# Patient Record
Sex: Male | Born: 1960 | Race: Black or African American | Hispanic: No | Marital: Single | Smoking: Current some day smoker
Health system: Southern US, Community
[De-identification: ages and names within clinical notes are randomized; demographics above are authoritative.]

## PROBLEM LIST (undated history)

## (undated) DIAGNOSIS — I509 Heart failure, unspecified: Secondary | ICD-10-CM

## (undated) DIAGNOSIS — N183 Chronic kidney disease, stage 3 (moderate): Secondary | ICD-10-CM

## (undated) DIAGNOSIS — I10 Essential (primary) hypertension: Secondary | ICD-10-CM

## (undated) DIAGNOSIS — M109 Gout, unspecified: Secondary | ICD-10-CM

## (undated) DIAGNOSIS — E785 Hyperlipidemia, unspecified: Secondary | ICD-10-CM

## (undated) DIAGNOSIS — D509 Iron deficiency anemia, unspecified: Secondary | ICD-10-CM

## (undated) HISTORY — DX: Iron deficiency anemia, unspecified: D50.9

## (undated) HISTORY — DX: Heart failure, unspecified: I50.9

## (undated) HISTORY — DX: Gout, unspecified: M10.9

## (undated) HISTORY — DX: Chronic kidney disease, stage 3 (moderate): N18.3

---

## 2012-06-06 ENCOUNTER — Inpatient Hospital Stay (HOSPITAL_BASED_OUTPATIENT_CLINIC_OR_DEPARTMENT_OTHER)
Admission: EM | Admit: 2012-06-06 | Discharge: 2012-06-10 | DRG: 293 | Disposition: A | Discharge status: Home / Self Care | Payer: Medicaid Other | Attending: Internal Medicine | Admitting: Internal Medicine

## 2012-06-06 ENCOUNTER — Encounter (HOSPITAL_BASED_OUTPATIENT_CLINIC_OR_DEPARTMENT_OTHER): Payer: Self-pay

## 2012-06-06 ENCOUNTER — Emergency Department (HOSPITAL_BASED_OUTPATIENT_CLINIC_OR_DEPARTMENT_OTHER): Payer: Medicaid Other

## 2012-06-06 DIAGNOSIS — M25532 Pain in left wrist: Secondary | ICD-10-CM | POA: Diagnosis present

## 2012-06-06 DIAGNOSIS — M7989 Other specified soft tissue disorders: Secondary | ICD-10-CM

## 2012-06-06 DIAGNOSIS — Z9119 Patient's noncompliance with other medical treatment and regimen: Secondary | ICD-10-CM

## 2012-06-06 DIAGNOSIS — I10 Essential (primary) hypertension: Secondary | ICD-10-CM | POA: Diagnosis present

## 2012-06-06 DIAGNOSIS — R7309 Other abnormal glucose: Secondary | ICD-10-CM | POA: Diagnosis present

## 2012-06-06 DIAGNOSIS — I119 Hypertensive heart disease without heart failure: Secondary | ICD-10-CM

## 2012-06-06 DIAGNOSIS — I129 Hypertensive chronic kidney disease with stage 1 through stage 4 chronic kidney disease, or unspecified chronic kidney disease: Secondary | ICD-10-CM | POA: Diagnosis present

## 2012-06-06 DIAGNOSIS — N189 Chronic kidney disease, unspecified: Secondary | ICD-10-CM | POA: Diagnosis present

## 2012-06-06 DIAGNOSIS — I517 Cardiomegaly: Secondary | ICD-10-CM | POA: Diagnosis present

## 2012-06-06 DIAGNOSIS — D509 Iron deficiency anemia, unspecified: Secondary | ICD-10-CM | POA: Diagnosis present

## 2012-06-06 DIAGNOSIS — I5032 Chronic diastolic (congestive) heart failure: Secondary | ICD-10-CM | POA: Diagnosis present

## 2012-06-06 DIAGNOSIS — D649 Anemia, unspecified: Secondary | ICD-10-CM

## 2012-06-06 DIAGNOSIS — E876 Hypokalemia: Secondary | ICD-10-CM | POA: Diagnosis present

## 2012-06-06 DIAGNOSIS — N183 Chronic kidney disease, stage 3 unspecified: Secondary | ICD-10-CM

## 2012-06-06 DIAGNOSIS — I509 Heart failure, unspecified: Secondary | ICD-10-CM

## 2012-06-06 DIAGNOSIS — E785 Hyperlipidemia, unspecified: Secondary | ICD-10-CM | POA: Diagnosis present

## 2012-06-06 DIAGNOSIS — M109 Gout, unspecified: Secondary | ICD-10-CM | POA: Diagnosis present

## 2012-06-06 DIAGNOSIS — F17200 Nicotine dependence, unspecified, uncomplicated: Secondary | ICD-10-CM | POA: Diagnosis present

## 2012-06-06 DIAGNOSIS — Z91199 Patient's noncompliance with other medical treatment and regimen due to unspecified reason: Secondary | ICD-10-CM

## 2012-06-06 HISTORY — DX: Hyperlipidemia, unspecified: E78.5

## 2012-06-06 HISTORY — DX: Iron deficiency anemia, unspecified: D50.9

## 2012-06-06 HISTORY — DX: Essential (primary) hypertension: I10

## 2012-06-06 HISTORY — DX: Heart failure, unspecified: I50.9

## 2012-06-06 HISTORY — DX: Chronic kidney disease, stage 3 unspecified: N18.30

## 2012-06-06 LAB — CBC WITH DIFFERENTIAL/PLATELET
Basophils Absolute: 0 10*3/uL (ref 0.0–0.1)
Eosinophils Absolute: 0.3 10*3/uL (ref 0.0–0.7)
Eosinophils Relative: 3 % (ref 0–5)
Lymphocytes Relative: 23 % (ref 12–46)
MCH: 27.8 pg (ref 26.0–34.0)
MCV: 77.3 fL — ABNORMAL LOW (ref 78.0–100.0)
Platelets: 225 10*3/uL (ref 150–400)
RDW: 13.7 % (ref 11.5–15.5)
WBC: 7.8 10*3/uL (ref 4.0–10.5)

## 2012-06-06 LAB — URIC ACID: Uric Acid, Serum: 9.7 mg/dL — ABNORMAL HIGH (ref 4.0–7.8)

## 2012-06-06 LAB — CBC
HCT: 35.8 % — ABNORMAL LOW (ref 39.0–52.0)
Hemoglobin: 12.8 g/dL — ABNORMAL LOW (ref 13.0–17.0)
MCV: 76.7 fL — ABNORMAL LOW (ref 78.0–100.0)
RBC: 4.67 MIL/uL (ref 4.22–5.81)
RDW: 14 % (ref 11.5–15.5)
WBC: 9.2 10*3/uL (ref 4.0–10.5)

## 2012-06-06 LAB — COMPREHENSIVE METABOLIC PANEL
ALT: 26 U/L (ref 0–53)
AST: 26 U/L (ref 0–37)
Calcium: 9.6 mg/dL (ref 8.4–10.5)
Sodium: 140 mEq/L (ref 135–145)
Total Protein: 7.1 g/dL (ref 6.0–8.3)

## 2012-06-06 LAB — CREATININE, SERUM: GFR calc Af Amer: 59 mL/min — ABNORMAL LOW (ref >90)

## 2012-06-06 LAB — RETICULOCYTES: Retic Count, Absolute: 51.4 10*3/uL (ref 19.0–186.0)

## 2012-06-06 MED ORDER — HYDRALAZINE HCL 20 MG/ML IJ SOLN
10.0000 mg | INTRAMUSCULAR | Status: DC | PRN
  Administered 2012-06-07 – 2012-06-09 (×3): 10 mg via INTRAVENOUS
  Filled 2012-06-06 (×3): qty 1

## 2012-06-06 MED ORDER — HYDRALAZINE HCL 20 MG/ML IJ SOLN
10.0000 mg | Freq: Once | INTRAMUSCULAR | Status: AC
  Administered 2012-06-06: 10 mg via INTRAVENOUS
  Filled 2012-06-06: qty 1

## 2012-06-06 MED ORDER — ACETAMINOPHEN 325 MG PO TABS
650.0000 mg | ORAL_TABLET | Freq: Four times a day (QID) | ORAL | Status: DC | PRN
  Filled 2012-06-06: qty 2

## 2012-06-06 MED ORDER — FUROSEMIDE 40 MG PO TABS
40.0000 mg | ORAL_TABLET | Freq: Every day | ORAL | Status: DC
  Administered 2012-06-07: 40 mg via ORAL
  Filled 2012-06-06 (×2): qty 1

## 2012-06-06 MED ORDER — HYDRALAZINE HCL 25 MG PO TABS
25.0000 mg | ORAL_TABLET | Freq: Three times a day (TID) | ORAL | Status: DC
  Administered 2012-06-06 – 2012-06-10 (×11): 25 mg via ORAL
  Filled 2012-06-06 (×14): qty 1

## 2012-06-06 MED ORDER — ENOXAPARIN SODIUM 40 MG/0.4ML ~~LOC~~ SOLN
40.0000 mg | SUBCUTANEOUS | Status: DC
  Administered 2012-06-06 – 2012-06-09 (×4): 40 mg via SUBCUTANEOUS
  Filled 2012-06-06 (×5): qty 0.4

## 2012-06-06 MED ORDER — ONDANSETRON HCL 4 MG/2ML IJ SOLN: 4.0000 mg | Freq: Four times a day (QID) | INTRAMUSCULAR | Status: DC | PRN

## 2012-06-06 MED ORDER — CLONIDINE HCL 0.1 MG PO TABS
0.2000 mg | ORAL_TABLET | Freq: Once | ORAL | Status: AC
  Administered 2012-06-06: 0.2 mg via ORAL
  Filled 2012-06-06: qty 2

## 2012-06-06 MED ORDER — HYDROCODONE-ACETAMINOPHEN 5-325 MG PO TABS
1.0000 | ORAL_TABLET | ORAL | Status: DC | PRN
  Administered 2012-06-06 – 2012-06-10 (×10): 1 via ORAL
  Filled 2012-06-06 (×10): qty 1

## 2012-06-06 MED ORDER — ACETAMINOPHEN 650 MG RE SUPP: 650.0000 mg | Freq: Four times a day (QID) | RECTAL | Status: DC | PRN

## 2012-06-06 MED ORDER — ONDANSETRON HCL 4 MG PO TABS: 4.0000 mg | ORAL_TABLET | Freq: Four times a day (QID) | ORAL | Status: DC | PRN

## 2012-06-06 MED ORDER — FUROSEMIDE 40 MG PO TABS
40.0000 mg | ORAL_TABLET | Freq: Once | ORAL | Status: AC
  Administered 2012-06-06: 40 mg via ORAL
  Filled 2012-06-06: qty 1

## 2012-06-06 MED ORDER — SODIUM CHLORIDE 0.9 % IJ SOLN
3.0000 mL | Freq: Two times a day (BID) | INTRAMUSCULAR | Status: DC
  Administered 2012-06-07 – 2012-06-08 (×2): 3 mL via INTRAVENOUS

## 2012-06-06 MED ORDER — SODIUM CHLORIDE 0.9 % IJ SOLN
3.0000 mL | Freq: Two times a day (BID) | INTRAMUSCULAR | Status: DC
  Administered 2012-06-06 – 2012-06-09 (×5): 3 mL via INTRAVENOUS

## 2012-06-06 NOTE — ED Notes (Addendum)
NSR on monitor denies chest pain or SOB lungs clear all fields, skin W/D alert x3 but pt report L wrist pain 5/10 denies injury.  BP remains elevated after medication 177/96 improved from previous 203/115  EDP aware no new orders obtained. [facility] here to transport to [facility] [id number] report called.

## 2012-06-06 NOTE — ED Provider Notes (Signed)
History     CSN: [id number]  Arrival date & time 06/06/12  1235   First MD Initiated Contact with Patient 06/06/12 1458      Chief Complaint  Patient presents with  . Arm Pain    (Consider location/radiation/quality/duration/timing/severity/associated sxs/prior treatment) Patient is a 52 y.o. male presenting with hypertension. The history is provided by the patient. No language interpreter was used.  Hypertension This is a chronic problem. The current episode started 1 to 4 weeks ago. The problem occurs constantly. The problem has been gradually worsening. Associated symptoms include joint swelling. Nothing aggravates the symptoms. He has tried nothing for the symptoms. The treatment provided moderate relief.   Pt complains of swelling to lower extremities and left arm.   Pt reports left arm is sore,   Pt has not taking blood pressure medications in several years.  Pt does not have a doctor.  Pt denies cough.  No shortness of breath. Past Medical History  Diagnosis Date  . Hypertension   . Hyperlipidemia     No past surgical history on file.  No family history on file.  History  Substance Use Topics  . Smoking status: Current Some Day Smoker  . Smokeless tobacco: Never Used  . Alcohol Use: Yes      Review of Systems  Musculoskeletal: Positive for joint swelling.  All other systems reviewed and are negative.    Allergies  Review of patient's allergies indicates no known allergies.  Home Medications   Current Outpatient Rx  Name  Route  Sig  Dispense  Refill  . ibuprofen (ADVIL,MOTRIN) 200 MG tablet   Oral   Take 200 mg by mouth every 6 (six) hours as needed for pain.           BP 207/133  Pulse 76  Temp(Src) 98.4 F (36.9 C) (Oral)  Resp 20  Ht 6' (1.829 m)  Wt 260 lb (117.935 kg)  BMI 35.25 kg/m2  SpO2 97%  Physical Exam  Nursing note and vitals reviewed. Constitutional: He is oriented to person, place, and time. He appears well-developed and  well-nourished.  HENT:  Head: Normocephalic.  Right Ear: External ear normal.  Left Ear: External ear normal.  Nose: Nose normal.  Mouth/Throat: Oropharynx is clear and moist.  Eyes: Conjunctivae and EOM are normal. Pupils are equal, round, and reactive to light.  Neck: Normal range of motion. Neck supple.  Cardiovascular: Normal rate.   Pulmonary/Chest: Effort normal.  Abdominal: Soft.  Musculoskeletal: He exhibits edema and tenderness.  Pitting edema lower extremity,  Swelling left arm,    Neurological: He is alert and oriented to person, place, and time. He has normal reflexes.  Skin: Skin is warm.    ED Course  Procedures (including critical care time)  Labs Reviewed  CBC WITH DIFFERENTIAL - Abnormal; Notable for the following:    Hemoglobin 12.6 (*)    HCT 35.1 (*)    MCV 77.3 (*)    All other components within normal limits  COMPREHENSIVE METABOLIC PANEL - Abnormal; Notable for the following:    Glucose, Bld 113 (*)    Creatinine, Ser 1.60 (*)    GFR calc non Af Amer 48 (*)    GFR calc Af Amer 55 (*)    All other components within normal limits  PRO B NATRIURETIC PEPTIDE - Abnormal; Notable for the following:    Pro B Natriuretic peptide (BNP) 990.2 (*)    All other components within normal limits  TROPONIN I  Dg Chest 2 View  06/06/2012  *RADIOLOGY REPORT*  Clinical Data: Left forearm pain, history of hypertension  CHEST - 2 VIEW  Comparison: None.  Findings: Mild cardiomegaly noted with ascending aortic ectasia and unfolding.  The lungs are clear.  Right hemidiaphragmatic eventration with presumed right basilar adjacent scarring atelectasis noted.  No pleural effusion.  No acute osseous finding.  IMPRESSION: Cardiomegaly and aortic ectasia.   Original Report Authenticated By: Christiana Pellant, M.D.      1. CHF (congestive heart failure)   2. Hypertension   3. Cardiomegaly - hypertensive       MDM   Date: 06/06/2012  Rate: 76  Rhythm: sinus arrhythmia  QRS  Axis: normal  Intervals: normal  ST/T Wave abnormalities: nonspecific ST changes  Conduction Disutrbances:none  Narrative Interpretation:   Old EKG Reviewed: none available    Pt given IV lasix,  Hydralazine IV.   Pt blood pressure unchanged.       Elson Areas, PA-C 06/06/12 1826  Medical screening examination/treatment/procedure(s) were performed by non-physician practitioner and as supervising physician I was immediately available for consultation/collaboration.  Derwood Kaplan, MD 06/08/12 1610

## 2012-06-06 NOTE — ED Notes (Signed)
Pt states that he woke up today with severe pain in the L forearm.  Pt has some swelling to the arm, c/o incr pain with flexion of wrist.  PMS intact.

## 2012-06-06 NOTE — Progress Notes (Signed)
Called  From [facility] for admission for CHF, uncontrolled HTN, left arm swelling, renal failure , anemia Harold Garza

## 2012-06-06 NOTE — H&P (Signed)
[facility] History and Physical  Harold Garza ZOX:[id number] DOB: 20-Oct-1960 DOA: 06/06/2012  Referring physician: ER physician. PCP: No primary provider on file.  Specialists: None.  Chief Complaint: Left wrist pain.  HPI: Harold Garza is a 52 y.o. male with history of hypertension who has not been taking his medications for last few years due to financial reasons presents to the ER at [facility] with complaints of left wrist pain. Patient has been having left wrist pain since morning and denies any trauma. Patient in addition also has right knee pain for last few weeks. Patient in the ER was found to have mild lower extremity edema and on further questioning patient states that over the last 3-4 weeks patient has been having exertional shortness of breath. Patient pressure also was found to be elevated. Chest x-ray showed cardiomegaly. Patient was given Lasix 40 mg by mouth and clonidine and hydralazine IV. Patient has been admitted for CHF. Patient denies any chest pain productive cough fever chills nausea vomiting abdominal pain. Patient states he has had similar symptoms of CHF 4-5 years ago. Denies having any cardiac catheter.  Review of Systems: As presented in the history of presenting illness, rest negative.  Past Medical History  Diagnosis Date  . Hypertension   . Hyperlipidemia    History reviewed. No pertinent past surgical history. Social History:  reports that he has been smoking.  He has never used smokeless tobacco. He reports that  drinks alcohol. He reports that he does not use illicit drugs. Lives at home. where does patient live-- Can do ADLs. Can patient participate in ADLs?  No Known Allergies  Family History  Problem Relation Age of Onset  . Hypertension Mother       Prior to Admission medications   Medication Sig Start Date End Date Taking? Authorizing Provider  ibuprofen (ADVIL,MOTRIN) 200 MG tablet Take 400 mg by mouth every 6  (six) hours as needed for pain.    Yes Historical Provider, MD   Physical Exam: Filed Vitals:   06/06/12 1830 06/06/12 1900 06/06/12 1930 06/06/12 2013  BP: 215/128 210/132 203/115 175/92  Pulse: 80  81 81  Temp:    98.1 F (36.7 C)  TempSrc:      Resp: 20 19 17 16   Height:      Weight:      SpO2: 100%  100% 98%     General:  Well-developed and nourished.  Eyes: Anicteric no pallor.  ENT: No discharge from the ears eyes nose mouth.  Neck: No mass felt.  Cardiovascular:  S1-S2 heard.  Respiratory: No rhonchi or crepitations.  Abdomen: Soft nontender bowel sounds present.  Skin:  No rash.  Musculoskeletal: Pain on moving left wrist. Bilateral lower extremity edema.  Psychiatric: Appears normal.  Neurologic: Alert awake oriented to time place and person. Moves all extremities.  Labs on Admission:  Basic Metabolic Panel:  Recent Labs Lab 06/06/12 1315  NA 140  K 3.7  CL 104  CO2 24  GLUCOSE 113*  BUN 18  CREATININE 1.60*  CALCIUM 9.6   Liver Function Tests:  Recent Labs Lab 06/06/12 1315  AST 26  ALT 26  ALKPHOS 56  BILITOT 0.4  PROT 7.1  ALBUMIN 3.7   No results found for this basename: LIPASE, AMYLASE,  in the last 168 hours No results found for this basename: AMMONIA,  in the last 168 hours CBC:  Recent Labs Lab 06/06/12 1315  WBC 7.8  NEUTROABS 5.2  HGB  12.6*  HCT 35.1*  MCV 77.3*  PLT 225   Cardiac Enzymes:  Recent Labs Lab 06/06/12 1315  TROPONINI <0.30    BNP (last 3 results)  Recent Labs  06/06/12 1315  PROBNP 990.2*   CBG: No results found for this basename: GLUCAP,  in the last 168 hours  Radiological Exams on Admission: Dg Chest 2 View  06/06/2012  *RADIOLOGY REPORT*  Clinical Data: Left forearm pain, history of hypertension  CHEST - 2 VIEW  Comparison: None.  Findings: Mild cardiomegaly noted with ascending aortic ectasia and unfolding.  The lungs are clear.  Right hemidiaphragmatic eventration with presumed  right basilar adjacent scarring atelectasis noted.  No pleural effusion.  No acute osseous finding.  IMPRESSION: Cardiomegaly and aortic ectasia.   Original Report Authenticated By: Christiana Pellant, M.D.     EKG: Independently reviewed. Normal sinus rhythm with first-degree AV block. Nonspecific T-wave changes.  Assessment/Plan Principal Problem:   CHF (congestive heart failure) Active Problems:   Hypertension   Hyperlipidemia   CKD (chronic kidney disease)   Left arm swelling   Anemia   1. CHF - patient has been placed on Lasix 40 mg daily. Closely follow intake output. Check 2-D echo for EF. 2. Hypertension uncontrolled - I have placed patient on hydralazine 25 mg by mouth 3 times a day. Patient has renal failure so I did not add any lisinopril and the patient has lower extremity edema so I have avoided Norvasc. 3. Left wrist pain and right knee pain - check x-rays. Check uric crystals and sedimentation rate. 4. Renal failure - probably chronic. Check UA and closely follow intake output and metabolic panel. 5. Anemia - check anemia panel. Closely follow CBC. Stool for occult blood. 6. Tobacco abuse - strongly advised to quit smoking. 7. Hyperglycemia - patient's wife states that he was diagnosed with borderline diabetes many years ago. Check hemoglobin A1c.    Code Status: Full code.  Family Communication: Patient's wife at the bedside.  Disposition Plan: Admit to inpatient.    Harold Garza N. [facility] Pager [phone].  If 7PM-7AM, please contact night-coverage [email or web address] Password [facility] 06/06/2012, 9:38 PM

## 2012-06-07 ENCOUNTER — Inpatient Hospital Stay (HOSPITAL_COMMUNITY): Payer: Medicaid Other

## 2012-06-07 DIAGNOSIS — M109 Gout, unspecified: Secondary | ICD-10-CM

## 2012-06-07 DIAGNOSIS — M25532 Pain in left wrist: Secondary | ICD-10-CM | POA: Diagnosis present

## 2012-06-07 DIAGNOSIS — R0602 Shortness of breath: Secondary | ICD-10-CM

## 2012-06-07 DIAGNOSIS — R609 Edema, unspecified: Secondary | ICD-10-CM

## 2012-06-07 LAB — BASIC METABOLIC PANEL
GFR calc non Af Amer: 54 mL/min — ABNORMAL LOW (ref >90)
Glucose, Bld: 119 mg/dL — ABNORMAL HIGH (ref 70–99)
Potassium: 3.1 mEq/L — ABNORMAL LOW (ref 3.5–5.1)
Sodium: 139 mEq/L (ref 135–145)

## 2012-06-07 LAB — RAPID URINE DRUG SCREEN, HOSP PERFORMED
Barbiturates: NOT DETECTED
Cocaine: NOT DETECTED

## 2012-06-07 LAB — URINALYSIS, ROUTINE W REFLEX MICROSCOPIC
Bilirubin Urine: NEGATIVE
Nitrite: NEGATIVE
Protein, ur: 100 mg/dL — AB
Specific Gravity, Urine: 1.016 (ref 1.005–1.030)
Urobilinogen, UA: 1 mg/dL (ref 0.0–1.0)

## 2012-06-07 LAB — URINE MICROSCOPIC-ADD ON

## 2012-06-07 LAB — FOLATE: Folate: 13.2 ng/mL

## 2012-06-07 LAB — IRON AND TIBC
Iron: 29 ug/dL — ABNORMAL LOW (ref 42–135)
Saturation Ratios: 10 % — ABNORMAL LOW (ref 20–55)
TIBC: 289 ug/dL (ref 215–435)
UIBC: 260 ug/dL (ref 125–400)

## 2012-06-07 LAB — CBC
Hemoglobin: 12.3 g/dL — ABNORMAL LOW (ref 13.0–17.0)
MCH: 27 pg (ref 26.0–34.0)
RBC: 4.55 MIL/uL (ref 4.22–5.81)
WBC: 9 10*3/uL (ref 4.0–10.5)

## 2012-06-07 LAB — HEMOGLOBIN A1C
Hgb A1c MFr Bld: 5.8 % — ABNORMAL HIGH
Mean Plasma Glucose: 120 mg/dL — ABNORMAL HIGH

## 2012-06-07 LAB — VITAMIN B12: Vitamin B-12: 399 pg/mL (ref 211–911)

## 2012-06-07 MED ORDER — AMLODIPINE BESYLATE 10 MG PO TABS
10.0000 mg | ORAL_TABLET | Freq: Every day | ORAL | Status: DC
  Administered 2012-06-07 – 2012-06-09 (×3): 10 mg via ORAL
  Filled 2012-06-07 (×4): qty 1

## 2012-06-07 MED ORDER — COLCHICINE 0.6 MG PO TABS
0.6000 mg | ORAL_TABLET | Freq: Three times a day (TID) | ORAL | Status: DC
  Administered 2012-06-07 – 2012-06-10 (×10): 0.6 mg via ORAL
  Filled 2012-06-07 (×12): qty 1

## 2012-06-07 MED ORDER — HYDROMORPHONE HCL PF 1 MG/ML IJ SOLN
1.0000 mg | INTRAMUSCULAR | Status: DC | PRN
  Administered 2012-06-07 – 2012-06-10 (×9): 1 mg via INTRAVENOUS
  Filled 2012-06-07 (×9): qty 1

## 2012-06-07 MED ORDER — POTASSIUM CHLORIDE CRYS ER 20 MEQ PO TBCR
40.0000 meq | EXTENDED_RELEASE_TABLET | Freq: Two times a day (BID) | ORAL | Status: AC
  Administered 2012-06-07 – 2012-06-08 (×2): 40 meq via ORAL
  Filled 2012-06-07 (×2): qty 2

## 2012-06-07 NOTE — Consult Note (Signed)
Reason for Consult:left hand and wrist swelling x 24 hours Referring Physician: laza  Harold Garza is an 52 y.o. male.  HPI: as above with multiple medical issues with 24 hour h/o left wrist and hand swelling with no trauma or penetrating injury  Past Medical History  Diagnosis Date  . Hypertension   . Hyperlipidemia     History reviewed. No pertinent past surgical history.  Family History  Problem Relation Age of Onset  . Hypertension Mother     Social History:  reports that he has been smoking.  He has never used smokeless tobacco. He reports that  drinks alcohol. He reports that he does not use illicit drugs.  Allergies: No Known Allergies  Medications:  Scheduled: . amLODipine  10 mg Oral QHS  . colchicine  0.6 mg Oral TID  . enoxaparin (LOVENOX) injection  40 mg Subcutaneous Q24H  . furosemide  40 mg Oral Daily  . hydrALAZINE  25 mg Oral Q8H  . sodium chloride  3 mL Intravenous Q12H  . sodium chloride  3 mL Intravenous Q12H    Results for orders placed during the hospital encounter of 06/06/12 (from the past 48 hour(s))  CBC WITH DIFFERENTIAL     Status: Abnormal   Collection Time    06/06/12  1:15 PM      Result Value Range   WBC 7.8  4.0 - 10.5 K/uL   RBC 4.54  4.22 - 5.81 MIL/uL   Hemoglobin 12.6 (*) 13.0 - 17.0 g/dL   HCT 45.4 (*) 09.8 - 11.9 %   MCV 77.3 (*) 78.0 - 100.0 fL   MCH 27.8  26.0 - 34.0 pg   MCHC 35.9  30.0 - 36.0 g/dL   RDW 14.7  82.9 - 56.2 %   Platelets 225  150 - 400 K/uL   Neutrophils Relative 66  43 - 77 %   Neutro Abs 5.2  1.7 - 7.7 K/uL   Lymphocytes Relative 23  12 - 46 %   Lymphs Abs 1.8  0.7 - 4.0 K/uL   Monocytes Relative 8  3 - 12 %   Monocytes Absolute 0.6  0.1 - 1.0 K/uL   Eosinophils Relative 3  0 - 5 %   Eosinophils Absolute 0.3  0.0 - 0.7 K/uL   Basophils Relative 0  0 - 1 %   Basophils Absolute 0.0  0.0 - 0.1 K/uL  COMPREHENSIVE METABOLIC PANEL     Status: Abnormal   Collection Time    06/06/12  1:15 PM       Result Value Range   Sodium 140  135 - 145 mEq/L   Potassium 3.7  3.5 - 5.1 mEq/L   Chloride 104  96 - 112 mEq/L   CO2 24  19 - 32 mEq/L   Glucose, Bld 113 (*) 70 - 99 mg/dL   BUN 18  6 - 23 mg/dL   Creatinine, Ser 1.30 (*) 0.50 - 1.35 mg/dL   Calcium 9.6  8.4 - 86.5 mg/dL   Total Protein 7.1  6.0 - 8.3 g/dL   Albumin 3.7  3.5 - 5.2 g/dL   AST 26  0 - 37 U/L   ALT 26  0 - 53 U/L   Alkaline Phosphatase 56  39 - 117 U/L   Total Bilirubin 0.4  0.3 - 1.2 mg/dL   GFR calc non Af Amer 48 (*) >90 mL/min   GFR calc Af Amer 55 (*) >90 mL/min   Comment:  The eGFR has been calculated     using the CKD EPI equation.     This calculation has not been     validated in all clinical     situations.     eGFR's persistently     <90 mL/min signify     possible Chronic Kidney Disease.  PRO B NATRIURETIC PEPTIDE     Status: Abnormal   Collection Time    06/06/12  1:15 PM      Result Value Range   Pro B Natriuretic peptide (BNP) 990.2 (*) 0 - 125 pg/mL  TROPONIN I     Status: None   Collection Time    06/06/12  1:15 PM      Result Value Range   Troponin I <0.30  <0.30 ng/mL   Comment:            Due to the release kinetics of cTnI,     a negative result within the first hours     of the onset of symptoms does not rule out     myocardial infarction with certainty.     If myocardial infarction is still suspected,     repeat the test at appropriate intervals.  TSH     Status: None   Collection Time    06/06/12  9:56 PM      Result Value Range   TSH 3.590  0.350 - 4.500 uIU/mL  URIC ACID     Status: Abnormal   Collection Time    06/06/12  9:56 PM      Result Value Range   Uric Acid, Serum 9.7 (*) 4.0 - 7.8 mg/dL  TROPONIN I     Status: None   Collection Time    06/06/12  9:56 PM      Result Value Range   Troponin I <0.30  <0.30 ng/mL   Comment:            Due to the release kinetics of cTnI,     a negative result within the first hours     of the onset of symptoms does  not rule out     myocardial infarction with certainty.     If myocardial infarction is still suspected,     repeat the test at appropriate intervals.  SEDIMENTATION RATE     Status: Abnormal   Collection Time    06/06/12  9:56 PM      Result Value Range   Sed Rate 30 (*) 0 - 16 mm/hr  CBC     Status: Abnormal   Collection Time    06/06/12  9:56 PM      Result Value Range   WBC 9.2  4.0 - 10.5 K/uL   RBC 4.67  4.22 - 5.81 MIL/uL   Hemoglobin 12.8 (*) 13.0 - 17.0 g/dL   HCT 16.1 (*) 09.6 - 04.5 %   MCV 76.7 (*) 78.0 - 100.0 fL   MCH 27.4  26.0 - 34.0 pg   MCHC 35.8  30.0 - 36.0 g/dL   RDW 40.9  81.1 - 91.4 %   Platelets 230  150 - 400 K/uL  CREATININE, SERUM     Status: Abnormal   Collection Time    06/06/12  9:56 PM      Result Value Range   Creatinine, Ser 1.52 (*) 0.50 - 1.35 mg/dL   GFR calc non Af Amer 51 (*) >90 mL/min   GFR calc Af Amer 59 (*) >90 mL/min  Comment:            The eGFR has been calculated     using the CKD EPI equation.     This calculation has not been     validated in all clinical     situations.     eGFR's persistently     <90 mL/min signify     possible Chronic Kidney Disease.  VITAMIN B12     Status: None   Collection Time    06/06/12  9:56 PM      Result Value Range   Vitamin B-12 399  211 - 911 pg/mL  FOLATE     Status: None   Collection Time    06/06/12  9:56 PM      Result Value Range   Folate 13.2     Comment: (NOTE)     Reference Ranges            Deficient:       0.4 - 3.3 ng/mL            Indeterminate:   3.4 - 5.4 ng/mL            Normal:              > 5.4 ng/mL  IRON AND TIBC     Status: Abnormal   Collection Time    06/06/12  9:56 PM      Result Value Range   Iron 29 (*) 42 - 135 ug/dL   TIBC 161  096 - 045 ug/dL   Saturation Ratios 10 (*) 20 - 55 %   UIBC 260  125 - 400 ug/dL  FERRITIN     Status: None   Collection Time    06/06/12  9:56 PM      Result Value Range   Ferritin 99  22 - 322 ng/mL  RETICULOCYTES      Status: None   Collection Time    06/06/12  9:56 PM      Result Value Range   Retic Ct Pct 1.1  0.4 - 3.1 %   RBC. 4.67  4.22 - 5.81 MIL/uL   Retic Count, Manual 51.4  19.0 - 186.0 K/uL  HEMOGLOBIN A1C     Status: Abnormal   Collection Time    06/06/12  9:56 PM      Result Value Range   Hemoglobin A1C 5.8 (*) <5.7 %   Comment: (NOTE)                                                                               According to the ADA Clinical Practice Recommendations for 2011, when     HbA1c is used as a screening test:      >=6.5%   Diagnostic of Diabetes Mellitus               (if abnormal result is confirmed)     5.7-6.4%   Increased risk of developing Diabetes Mellitus     References:Diagnosis and Classification of Diabetes Mellitus,Diabetes     Care,2011,34(Suppl 1):S62-S69 and Standards of Medical Care in             Diabetes - 2011,Diabetes WUJW,1191,47 (  Suppl 1):S11-S61.   Mean Plasma Glucose 120 (*) <117 mg/dL  BASIC METABOLIC PANEL     Status: Abnormal   Collection Time    06/07/12  5:00 AM      Result Value Range   Sodium 139  135 - 145 mEq/L   Potassium 3.1 (*) 3.5 - 5.1 mEq/L   Chloride 104  96 - 112 mEq/L   CO2 27  19 - 32 mEq/L   Glucose, Bld 119 (*) 70 - 99 mg/dL   BUN 15  6 - 23 mg/dL   Creatinine, Ser 9.81 (*) 0.50 - 1.35 mg/dL   Calcium 9.0  8.4 - 19.1 mg/dL   GFR calc non Af Amer 54 (*) >90 mL/min   GFR calc Af Amer 62 (*) >90 mL/min   Comment:            The eGFR has been calculated     using the CKD EPI equation.     This calculation has not been     validated in all clinical     situations.     eGFR's persistently     <90 mL/min signify     possible Chronic Kidney Disease.  CBC     Status: Abnormal   Collection Time    06/07/12  5:00 AM      Result Value Range   WBC 9.0  4.0 - 10.5 K/uL   RBC 4.55  4.22 - 5.81 MIL/uL   Hemoglobin 12.3 (*) 13.0 - 17.0 g/dL   HCT 47.8 (*) 29.5 - 62.1 %   MCV 77.8 (*) 78.0 - 100.0 fL   MCH 27.0  26.0 - 34.0 pg    MCHC 34.7  30.0 - 36.0 g/dL   RDW 30.8  65.7 - 84.6 %   Platelets 221  150 - 400 K/uL  URINALYSIS, ROUTINE W REFLEX MICROSCOPIC     Status: Abnormal   Collection Time    06/07/12  7:26 AM      Result Value Range   Color, Urine YELLOW  YELLOW   APPearance CLEAR  CLEAR   Specific Gravity, Urine 1.016  1.005 - 1.030   pH 6.5  5.0 - 8.0   Glucose, UA NEGATIVE  NEGATIVE mg/dL   Hgb urine dipstick NEGATIVE  NEGATIVE   Bilirubin Urine NEGATIVE  NEGATIVE   Ketones, ur NEGATIVE  NEGATIVE mg/dL   Protein, ur 962 (*) NEGATIVE mg/dL   Urobilinogen, UA 1.0  0.0 - 1.0 mg/dL   Nitrite NEGATIVE  NEGATIVE   Leukocytes, UA NEGATIVE  NEGATIVE  URINE MICROSCOPIC-ADD ON     Status: None   Collection Time    06/07/12  7:26 AM      Result Value Range   Squamous Epithelial / LPF RARE  RARE   WBC, UA 0-2  <3 WBC/hpf   RBC / HPF 0-2  <3 RBC/hpf   Bacteria, UA RARE  RARE  URINE RAPID DRUG SCREEN (HOSP PERFORMED)     Status: Abnormal   Collection Time    06/07/12  7:27 AM      Result Value Range   Opiates NONE DETECTED  NONE DETECTED   Cocaine NONE DETECTED  NONE DETECTED   Benzodiazepines NONE DETECTED  NONE DETECTED   Amphetamines NONE DETECTED  NONE DETECTED   Tetrahydrocannabinol POSITIVE (*) NONE DETECTED   Barbiturates NONE DETECTED  NONE DETECTED   Comment:            DRUG SCREEN FOR MEDICAL PURPOSES  ONLY.  IF CONFIRMATION IS NEEDED     FOR ANY PURPOSE, NOTIFY LAB     WITHIN 5 DAYS.                LOWEST DETECTABLE LIMITS     FOR URINE DRUG SCREEN     Drug Class       Cutoff (ng/mL)     Amphetamine      1000     Barbiturate      200     Benzodiazepine   200     Tricyclics       300     Opiates          300     Cocaine          300     THC              50    Dg Chest 2 View  06/06/2012  *RADIOLOGY REPORT*  Clinical Data: Left forearm pain, history of hypertension  CHEST - 2 VIEW  Comparison: None.  Findings: Mild cardiomegaly noted with ascending aortic ectasia and unfolding.   The lungs are clear.  Right hemidiaphragmatic eventration with presumed right basilar adjacent scarring atelectasis noted.  No pleural effusion.  No acute osseous finding.  IMPRESSION: Cardiomegaly and aortic ectasia.   Original Report Authenticated By: Christiana Pellant, M.D.    Dg Wrist 2 Views Left  06/07/2012  *RADIOLOGY REPORT*  Clinical Data: 52 year old male pain.  Swelling.  LEFT WRIST - 2 VIEW  Comparison: None.  Findings: Bone mineralization is within normal limits.  Distal radius and ulna, intact.  Carpal bone alignment preserved.  Joint spaces preserved.  Metacarpals intact.  Questionable remote/healed fracture at the proximal fifth metacarpal.  No acute fracture identified.  IMPRESSION: No acute fracture or dislocation identified about the left wrist.   Original Report Authenticated By: Erskine Speed, M.D.    Dg Knee 1-2 Views Right  06/07/2012  *RADIOLOGY REPORT*  Clinical Data: Knee pain and swelling.  RIGHT KNEE - 1-2 VIEW  Comparison: None.  Findings: No evidence of fracture or dislocation.  No evidence of knee joint effusion.  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  No other significant bone abnormality identified.  IMPRESSION: Moderate tricompartmental osteoarthritis.   Original Report Authenticated By: Myles Rosenthal, M.D.     Review of Systems  All other systems reviewed and are negative.   Blood pressure 200/130, pulse 96, temperature 98.7 F (37.1 C), temperature source Oral, resp. rate 20, height 6' (1.829 m), weight 118.343 kg (260 lb 14.4 oz), SpO2 100.00%. Physical Exam  Constitutional: He is oriented to person, place, and time. He appears well-developed and well-nourished.  HENT:  Head: Normocephalic and atraumatic.  Cardiovascular: Normal rate.   Respiratory: Effort normal.  Musculoskeletal:       Left wrist: He exhibits swelling and effusion.  Neurological: He is alert and oriented to person, place, and time.  Skin: Skin is warm.  Psychiatric: He has a normal  mood and affect. His behavior is normal. Judgment and thought content normal.    Assessment/Plan: As above  Uric acid elevated and clinical picture c/w acute gout  Would agree with medical management at this point  No surgical intervention needed  Keishawna Carranza A 06/07/2012, 11:51 AM

## 2012-06-07 NOTE — Progress Notes (Signed)
[facility] PROGRESS NOTE  Harold Garza ZOX:[id number] DOB: September 03, 1960 DOA: 06/06/2012 PCP: No primary provider on file.  Assessment/Plan: 1. Left wrist arhtiris - most likely gout attack - trying to get ortho/hand to help with aspiration. Start colchicine and iv pain medications 2. CKD 3. HTN - uncontrolled - probably also from pain - started on po meds on admission - will need up titration 4. CHF - probable diastolic - await echo   Code Status: full Family Communication: patient    Disposition Plan: home    Consultants:    HPI/Subjective: C/o severe 10/10 left wrist pain   Objective: Filed Vitals:   06/06/12 2100 06/07/12 0540 06/07/12 0542 06/07/12 1030  BP: 189/116 180/108 180/108 200/130  Pulse: 80  96   Temp: 98.4 F (36.9 C)  98.7 F (37.1 C)   TempSrc:   Oral   Resp: 20     Height: 6' (1.829 m)     Weight: 118.343 kg (260 lb 14.4 oz)     SpO2: 100%  100%    Patient Vitals for the past 24 hrs:  BP Temp Temp src Pulse Resp SpO2 Height Weight  06/07/12 1030 200/130 mmHg - - - - - - -  06/07/12 0542 180/108 mmHg 98.7 F (37.1 C) Oral 96 - 100 % - -  06/07/12 0540 180/108 mmHg - - - - - - -  06/06/12 2100 189/116 mmHg 98.4 F (36.9 C) - 80 20 100 % 6' (1.829 m) 118.343 kg (260 lb 14.4 oz)  06/06/12 2013 175/92 mmHg 98.1 F (36.7 C) - 81 16 98 % - -  06/06/12 1930 203/115 mmHg - - 81 17 100 % - -  06/06/12 1900 210/132 mmHg - - - 19 - - -  06/06/12 1830 215/128 mmHg - - 80 20 100 % - -  06/06/12 1800 204/157 mmHg - - - 26 - - -  06/06/12 1744 192/118 mmHg - - 81 19 98 % - -  06/06/12 1730 213/127 mmHg - - 79 24 98 % - -  06/06/12 1700 191/116 mmHg - - 75 22 99 % - -  06/06/12 1630 204/131 mmHg - - 73 21 98 % - -  06/06/12 1600 218/131 mmHg - - 81 25 99 % - -  06/06/12 1309 207/133 mmHg - - 76 - - - -  06/06/12 1246 205/138 mmHg 98.4 F (36.9 C) Oral 81 20 97 % 6' (1.829 m) 117.935 kg (260 lb)     Intake/Output Summary (Last 24 hours) at  06/07/12 1130 Last data filed at 06/06/12 2100  Gross per 24 hour  Intake      0 ml  Output   1065 ml  Net  -1065 ml   Filed Weights   06/06/12 1246 06/06/12 2100  Weight: 117.935 kg (260 lb) 118.343 kg (260 lb 14.4 oz)    Exam:   General:  axox3  Cardiovascular: rrr  Respiratory: ctab   Abdomen: soft nt  Left wrist edema, erythema, pain     Data Reviewed: Basic Metabolic Panel:  Recent Labs Lab 06/06/12 1315 06/06/12 2156 06/07/12 0500  NA 140  --  139  K 3.7  --  3.1*  CL 104  --  104  CO2 24  --  27  GLUCOSE 113*  --  119*  BUN 18  --  15  CREATININE 1.60* 1.52* 1.45*  CALCIUM 9.6  --  9.0   Liver Function Tests:  Recent Labs Lab 06/06/12  1315  AST 26  ALT 26  ALKPHOS 56  BILITOT 0.4  PROT 7.1  ALBUMIN 3.7   No results found for this basename: LIPASE, AMYLASE,  in the last 168 hours No results found for this basename: AMMONIA,  in the last 168 hours CBC:  Recent Labs Lab 06/06/12 1315 06/06/12 2156 06/07/12 0500  WBC 7.8 9.2 9.0  NEUTROABS 5.2  --   --   HGB 12.6* 12.8* 12.3*  HCT 35.1* 35.8* 35.4*  MCV 77.3* 76.7* 77.8*  PLT 225 230 221   Cardiac Enzymes:  Recent Labs Lab 06/06/12 1315 06/06/12 2156  TROPONINI <0.30 <0.30   BNP (last 3 results)  Recent Labs  06/06/12 1315  PROBNP 990.2*   CBG: No results found for this basename: GLUCAP,  in the last 168 hours  No results found for this or any previous visit (from the past 240 hour(s)).   Studies: Dg Chest 2 View  06/06/2012  *RADIOLOGY REPORT*  Clinical Data: Left forearm pain, history of hypertension  CHEST - 2 VIEW  Comparison: None.  Findings: Mild cardiomegaly noted with ascending aortic ectasia and unfolding.  The lungs are clear.  Right hemidiaphragmatic eventration with presumed right basilar adjacent scarring atelectasis noted.  No pleural effusion.  No acute osseous finding.  IMPRESSION: Cardiomegaly and aortic ectasia.   Original Report Authenticated By:  Christiana Pellant, M.D.    Dg Wrist 2 Views Left  06/07/2012  *RADIOLOGY REPORT*  Clinical Data: 52 year old male pain.  Swelling.  LEFT WRIST - 2 VIEW  Comparison: None.  Findings: Bone mineralization is within normal limits.  Distal radius and ulna, intact.  Carpal bone alignment preserved.  Joint spaces preserved.  Metacarpals intact.  Questionable remote/healed fracture at the proximal fifth metacarpal.  No acute fracture identified.  IMPRESSION: No acute fracture or dislocation identified about the left wrist.   Original Report Authenticated By: Erskine Speed, M.D.    Dg Knee 1-2 Views Right  06/07/2012  *RADIOLOGY REPORT*  Clinical Data: Knee pain and swelling.  RIGHT KNEE - 1-2 VIEW  Comparison: None.  Findings: No evidence of fracture or dislocation.  No evidence of knee joint effusion.  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  No other significant bone abnormality identified.  IMPRESSION: Moderate tricompartmental osteoarthritis.   Original Report Authenticated By: Myles Rosenthal, M.D.     Scheduled Meds: . amLODipine  10 mg Oral QHS  . colchicine  0.6 mg Oral TID  . enoxaparin (LOVENOX) injection  40 mg Subcutaneous Q24H  . furosemide  40 mg Oral Daily  . hydrALAZINE  25 mg Oral Q8H  . sodium chloride  3 mL Intravenous Q12H  . sodium chloride  3 mL Intravenous Q12H   Continuous Infusions:   Principal Problem:   CHF (congestive heart failure) Active Problems:   Hypertension   Hyperlipidemia   CKD (chronic kidney disease)   Anemia   Left wrist pain    Harold Garza  [facility] Pager [phone]. If 7PM-7AM, please contact night-coverage at [email or web address], password [facility] 06/07/2012, 11:30 AM  LOS: 1 day

## 2012-06-07 NOTE — Progress Notes (Signed)
Utilization review completed.  

## 2012-06-07 NOTE — Progress Notes (Signed)
pts BP 200/130 manual, PRN hydrazaline given; pt also c/o pain in L wrist/knee, uric acid elevated at 9.7; Dr.Laza paged and made aware of above; orders received for dilaudid and colchicine; will continue to monitor

## 2012-06-07 NOTE — Progress Notes (Signed)
*  PRELIMINARY RESULTS* Echocardiogram 2D Echocardiogram has been performed.  Harold Garza 06/07/2012, 11:18 AM

## 2012-06-07 NOTE — Progress Notes (Signed)
VASCULAR LAB PRELIMINARY  PRELIMINARY  PRELIMINARY  PRELIMINARY  Bilateral lower extremity venous Dopplers completed.    Preliminary report:  There is no DVT or SVT noted in the bilateral lower extremities.  Jaspal Pultz, RVT 06/07/2012, 9:35 AM

## 2012-06-08 ENCOUNTER — Inpatient Hospital Stay (HOSPITAL_COMMUNITY): Payer: Medicaid Other

## 2012-06-08 DIAGNOSIS — I119 Hypertensive heart disease without heart failure: Secondary | ICD-10-CM

## 2012-06-08 LAB — CBC
MCHC: 35 g/dL (ref 30.0–36.0)
Platelets: 223 10*3/uL (ref 150–400)
RDW: 14.4 % (ref 11.5–15.5)
WBC: 10.8 10*3/uL — ABNORMAL HIGH (ref 4.0–10.5)

## 2012-06-08 LAB — PROTEIN / CREATININE RATIO, URINE
Creatinine, Urine: 229.37 mg/dL
Protein Creatinine Ratio: 0.62 — ABNORMAL HIGH (ref 0.00–0.15)
Total Protein, Urine: 141.1 mg/dL

## 2012-06-08 LAB — BASIC METABOLIC PANEL
Calcium: 9.2 mg/dL (ref 8.4–10.5)
Chloride: 101 mEq/L (ref 96–112)
Creatinine, Ser: 1.73 mg/dL — ABNORMAL HIGH (ref 0.50–1.35)
GFR calc Af Amer: 50 mL/min — ABNORMAL LOW (ref >90)
GFR calc non Af Amer: 43 mL/min — ABNORMAL LOW (ref >90)

## 2012-06-08 MED ORDER — PREDNISONE 50 MG PO TABS
50.0000 mg | ORAL_TABLET | Freq: Once | ORAL | Status: AC
  Administered 2012-06-08: 50 mg via ORAL
  Filled 2012-06-08: qty 1

## 2012-06-08 MED ORDER — FUROSEMIDE 40 MG PO TABS
40.0000 mg | ORAL_TABLET | Freq: Two times a day (BID) | ORAL | Status: DC
  Administered 2012-06-08 – 2012-06-10 (×4): 40 mg via ORAL
  Filled 2012-06-08 (×6): qty 1

## 2012-06-08 NOTE — Progress Notes (Signed)
[facility] PROGRESS NOTE  Harold Garza ZOX:[id number] DOB: 01-18-1961 DOA: 06/06/2012 PCP: No primary provider on file.  Assessment/Plan: 1. Left wrist arhtiris - most likely gout attack - Started colchicine TID on 06/07/12 with little improvement - prednisone 50 mg po given 06/08/12 . Appreciate Dr. Mina Garza assistance.  2. CKD - ? Etiology - probably hypertensive -  urinalysis positive for proteinuria - will quantify - will order also spep and renal US 3. HTN - uncontrolled - probably also from pain - started on po meds on admission - will need up titration 4. CHF symptoms - ? Due to renal failure and volume overload - echo without systolic or diastolic failure  5. Hypokalemia - replete and follow   Code Status: full Family Communication: patient    Disposition Plan: home    Consultants:    HPI/Subjective: C/o severe 10/10 left wrist pain   Objective: Filed Vitals:   06/07/12 1349 06/07/12 2048 06/07/12 2158 06/08/12 0445  BP: 154/81 194/118 153/94 142/94  Pulse: 102 105  95  Temp: 98.1 F (36.7 C) 100.1 F (37.8 C)  98.6 F (37 C)  TempSrc: Oral Oral  Oral  Resp: 20 20  20   Height:      Weight:    116.121 kg (256 lb)  SpO2: 98%   100%   Patient Vitals for the past 24 hrs:  BP Temp Temp src Pulse Resp SpO2 Weight  06/08/12 0445 142/94 mmHg 98.6 F (37 C) Oral 95 20 100 % 116.121 kg (256 lb)  06/07/12 2158 153/94 mmHg - - - - - -  06/07/12 2048 194/118 mmHg 100.1 F (37.8 C) Oral 105 20 - -  06/07/12 1349 154/81 mmHg 98.1 F (36.7 C) Oral 102 20 98 % -  06/07/12 1030 200/130 mmHg - - - - - -     Intake/Output Summary (Last 24 hours) at 06/08/12 0909 Last data filed at 06/07/12 2049  Gross per 24 hour  Intake    723 ml  Output      0 ml  Net    723 ml   Filed Weights   06/06/12 1246 06/06/12 2100 06/08/12 0445  Weight: 117.935 kg (260 lb) 118.343 kg (260 lb 14.4 oz) 116.121 kg (256 lb)    Exam:   General:  axox3  Cardiovascular:  rrr  Respiratory: ctab   Abdomen: soft nt  Left wrist edema, erythema, pain     Data Reviewed: Basic Metabolic Panel:  Recent Labs Lab 06/06/12 1315 06/06/12 2156 06/07/12 0500 06/08/12 0515  NA 140  --  139 138  K 3.7  --  3.1* 3.2*  CL 104  --  104 101  CO2 24  --  27 23  GLUCOSE 113*  --  119* 113*  BUN 18  --  15 16  CREATININE 1.60* 1.52* 1.45* 1.73*  CALCIUM 9.6  --  9.0 9.2   Liver Function Tests:  Recent Labs Lab 06/06/12 1315  AST 26  ALT 26  ALKPHOS 56  BILITOT 0.4  PROT 7.1  ALBUMIN 3.7   No results found for this basename: LIPASE, AMYLASE,  in the last 168 hours No results found for this basename: AMMONIA,  in the last 168 hours CBC:  Recent Labs Lab 06/06/12 1315 06/06/12 2156 06/07/12 0500 06/08/12 0515  WBC 7.8 9.2 9.0 10.8*  NEUTROABS 5.2  --   --   --   HGB 12.6* 12.8* 12.3* 12.6*  HCT 35.1* 35.8* 35.4* 36.0*  MCV  77.3* 76.7* 77.8* 77.3*  PLT 225 230 221 223   Cardiac Enzymes:  Recent Labs Lab 06/06/12 1315 06/06/12 2156  TROPONINI <0.30 <0.30   BNP (last 3 results)  Recent Labs  06/06/12 1315  PROBNP 990.2*   CBG: No results found for this basename: GLUCAP,  in the last 168 hours  No results found for this or any previous visit (from the past 240 hour(s)).   Studies: Dg Chest 2 View  06/06/2012  *RADIOLOGY REPORT*  Clinical Data: Left forearm pain, history of hypertension  CHEST - 2 VIEW  Comparison: None.  Findings: Mild cardiomegaly noted with ascending aortic ectasia and unfolding.  The lungs are clear.  Right hemidiaphragmatic eventration with presumed right basilar adjacent scarring atelectasis noted.  No pleural effusion.  No acute osseous finding.  IMPRESSION: Cardiomegaly and aortic ectasia.   Original Report Authenticated By: Harold Garza, M.D.    Dg Wrist 2 Views Left  06/07/2012  *RADIOLOGY REPORT*  Clinical Data: 52 year old male pain.  Swelling.  LEFT WRIST - 2 VIEW  Comparison: None.  Findings: Bone  mineralization is within normal limits.  Distal radius and ulna, intact.  Carpal bone alignment preserved.  Joint spaces preserved.  Metacarpals intact.  Questionable remote/healed fracture at the proximal fifth metacarpal.  No acute fracture identified.  IMPRESSION: No acute fracture or dislocation identified about the left wrist.   Original Report Authenticated By: Harold Garza, M.D.    Dg Knee 1-2 Views Right  06/07/2012  *RADIOLOGY REPORT*  Clinical Data: Knee pain and swelling.  RIGHT KNEE - 1-2 VIEW  Comparison: None.  Findings: No evidence of fracture or dislocation.  No evidence of knee joint effusion.  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  No other significant bone abnormality identified.  IMPRESSION: Moderate tricompartmental osteoarthritis.   Original Report Authenticated By: Harold Garza, M.D.     Scheduled Meds: . amLODipine  10 mg Oral QHS  . colchicine  0.6 mg Oral TID  . enoxaparin (LOVENOX) injection  40 mg Subcutaneous Q24H  . furosemide  40 mg Oral Daily  . hydrALAZINE  25 mg Oral Q8H  . potassium chloride  40 mEq Oral BID  . predniSONE  50 mg Oral Once  . sodium chloride  3 mL Intravenous Q12H  . sodium chloride  3 mL Intravenous Q12H   Continuous Infusions:   Principal Problem:   CHF (congestive heart failure) Active Problems:   Hypertension   Hyperlipidemia   CKD (chronic kidney disease)   Anemia   Left wrist pain    Harold Garza  [facility] Pager [phone]. If 7PM-7AM, please contact night-coverage at [email or web address], password [facility] 06/08/2012, 9:09 AM  LOS: 2 days

## 2012-06-08 NOTE — Care Management Note (Addendum)
    Page 1 of 2   06/10/2012     3:16:49 PM   CARE MANAGEMENT NOTE 06/10/2012  Patient:  Harold Garza,Harold Garza   Account Number:  [id number]  Date Initiated:  06/08/2012  Documentation initiated by:  GRAVES-BIGELOW,Auriella Wieand  Subjective/Objective Assessment:   The pt admitted with left wrist pain and plans to be d/c Wed.     Action/Plan:   CM will make a follow up appointment at the [facility]. The new location will be on [address]. CM did call for time and is receiving a vm. Looked at the medication list and some meds are not on the $4.00 list @ [facility].   Anticipated DC Date:  06/09/2012   Anticipated DC Plan:    In-house referral  Financial Counselor      DC Planning Services  CM consult  Medication Assistance  [facility]      Choice offered to / List presented to:             Status of service:  Completed, signed off Medicare Important Message given?   (If response is "NO", the following Medicare IM given date fields will be blank) Date Medicare IM given:   Date Additional Medicare IM given:    Discharge Disposition:  HOME/SELF CARE  Per UR Regulation:  Reviewed for med. necessity/level of care/duration of stay  If discussed at Long Length of Stay Meetings, dates discussed:    Comments:   1514 CM provide pt the price of each medication he was being d/c on. Pt states that he will be able to afford medications. He states his girlfriend will be able to assist. Match not utilized at this time. Tomi Bamberger, RN,BSN 06-10-12  1024 06-10-12 [name], [place] [phone] CM made f/u appointment for [facility] at the [facility]. CM did call [facility] to price prednisone. Not sure if the dosage will be tapered. CM will discuss with the MD. Cm will then decide if Match Program needs to be used. Will f/u.   [address] Tomi Bamberger, RN, BSN [phone] CM did try to make an appointment today for the [facility]. Received vm again. CM will continue to call back to schedule.  CM did call [facility] to get prices and lasix will be $4.00, amlodipine will be $26.47, hydralazine $4.00. CM did check cost on colchicine and since it is not generic anymore the price will be $562. 93. CM did speak to the pharmacist on the floor and he will contact MD LAZA to see what the plan will be for the pt at d/c for this med. Question if to keep on colchicine vs change to allopurinol. CM will continue to monitor for disposition needs. Pt may need the [facility]. Financial Counselor was also notified to call the pt. Stanton Kidney RN BSN [phone]

## 2012-06-09 DIAGNOSIS — M25539 Pain in unspecified wrist: Secondary | ICD-10-CM

## 2012-06-09 DIAGNOSIS — E785 Hyperlipidemia, unspecified: Secondary | ICD-10-CM

## 2012-06-09 LAB — BASIC METABOLIC PANEL
Calcium: 9.6 mg/dL (ref 8.4–10.5)
Creatinine, Ser: 1.61 mg/dL — ABNORMAL HIGH (ref 0.50–1.35)
GFR calc non Af Amer: 47 mL/min — ABNORMAL LOW (ref >90)
Glucose, Bld: 118 mg/dL — ABNORMAL HIGH (ref 70–99)
Sodium: 137 mEq/L (ref 135–145)

## 2012-06-09 MED ORDER — ALUM & MAG HYDROXIDE-SIMETH 200-200-20 MG/5ML PO SUSP
30.0000 mL | Freq: Once | ORAL | Status: AC
  Administered 2012-06-09: 30 mL via ORAL
  Filled 2012-06-09: qty 30

## 2012-06-09 MED ORDER — BISACODYL 5 MG PO TBEC
5.0000 mg | DELAYED_RELEASE_TABLET | Freq: Every day | ORAL | Status: DC | PRN
  Administered 2012-06-09: 5 mg via ORAL
  Filled 2012-06-09: qty 1

## 2012-06-09 MED ORDER — PREDNISONE 50 MG PO TABS
60.0000 mg | ORAL_TABLET | Freq: Every day | ORAL | Status: DC
  Administered 2012-06-09 – 2012-06-10 (×2): 60 mg via ORAL
  Filled 2012-06-09 (×3): qty 1

## 2012-06-09 NOTE — Progress Notes (Addendum)
Patient ID: Harold Garza  male  OZD:[id number]    DOB: 01-06-1961    DOA: 06/06/2012  PCP: No primary provider on file.  Assessment/Plan:   1. Left wrist arthritis  - most likely gout attack - uric acid 9.7  - Started colchicine TID on 06/07/12 - prednisone 50 mg po given 06/08/12, I will give prednisone 60mg  today, assess for improvement - Dr. Mina Marble (hand surgery) evaluated the patient on 5/12 and recommended medical management.  - Left wrist Xray negative for any fracture or dislocation. If no significant improvement by tomorrow, will need probably further imaging, hand surgery to look at it again.    2. CKD -  probably hypertensive nephropathy - urinalysis positive for proteinuria, F/U SPEP, UPEP - renal ultrasound shows medical renal disease for both kidneys, no solid mass or hydronephrosis - Cr improving today, but will need outpatient follow-up with nephrology 3. HTN - uncontrolled  - started on po meds on admission, increased hydralazine to 50mg  TID, continue Lasix. Will add coreg if still not controlled 4. CHF symptoms - ? Due to renal failure and volume overload - echo without systolic or diastolic failure   DVT Prophylaxis:  Code Status: Full  Disposition:    Subjective: C/o pain in left wrist and swelling  Objective: Weight change: -0.907 kg (-2 lb)  Intake/Output Summary (Last 24 hours) at 06/09/12 1049 Last data filed at 06/09/12 0900  Gross per 24 hour  Intake    483 ml  Output      0 ml  Net    483 ml   Blood pressure 169/98, pulse 95, temperature 98.3 F (36.8 C), temperature source Oral, resp. rate 20, height 6' (1.829 m), weight 115.214 kg (254 lb), SpO2 96.00%.  Physical Exam: General: Alert and awake, oriented x3, not in any acute distress. CVS: S1-S2 clear, no murmur rubs or gallops Chest: clear to auscultation bilaterally, no wheezing, rales or rhonchi Abdomen: soft nontender, nondistended, normal bowel sounds Extremities: no cyanosis,  clubbing or edema noted bilaterally, left wrist swelling, erythema, tenderness   Lab Results: Basic Metabolic Panel:  Recent Labs Lab 06/08/12 0515 06/09/12 0430  NA 138 137  K 3.2* 3.6  CL 101 102  CO2 23 24  GLUCOSE 113* 118*  BUN 16 23  CREATININE 1.73* 1.61*  CALCIUM 9.2 9.6   Liver Function Tests:  Recent Labs Lab 06/06/12 1315  AST 26  ALT 26  ALKPHOS 56  BILITOT 0.4  PROT 7.1  ALBUMIN 3.7   No results found for this basename: LIPASE, AMYLASE,  in the last 168 hours No results found for this basename: AMMONIA,  in the last 168 hours CBC:  Recent Labs Lab 06/06/12 1315  06/07/12 0500 06/08/12 0515  WBC 7.8  < > 9.0 10.8*  NEUTROABS 5.2  --   --   --   HGB 12.6*  < > 12.3* 12.6*  HCT 35.1*  < > 35.4* 36.0*  MCV 77.3*  < > 77.8* 77.3*  PLT 225  < > 221 223  < > = values in this interval not displayed. Cardiac Enzymes:  Recent Labs Lab 06/06/12 1315 06/06/12 2156  TROPONINI <0.30 <0.30   BNP: No components found with this basename: POCBNP,  CBG: No results found for this basename: GLUCAP,  in the last 168 hours   Micro Results: No results found for this or any previous visit (from the past 240 hour(s)).  Studies/Results: Dg Chest 2 View  06/06/2012   *RADIOLOGY REPORT*  Clinical Data: Left forearm pain, history of hypertension  CHEST - 2 VIEW  Comparison: None.  Findings: Mild cardiomegaly noted with ascending aortic ectasia and unfolding.  The lungs are clear.  Right hemidiaphragmatic eventration with presumed right basilar adjacent scarring atelectasis noted.  No pleural effusion.  No acute osseous finding.  IMPRESSION: Cardiomegaly and aortic ectasia.   Original Report Authenticated By: Christiana Pellant, M.D.   Dg Wrist 2 Views Left  06/07/2012   *RADIOLOGY REPORT*  Clinical Data: 52 year old male pain.  Swelling.  LEFT WRIST - 2 VIEW  Comparison: None.  Findings: Bone mineralization is within normal limits.  Distal radius and ulna, intact.   Carpal bone alignment preserved.  Joint spaces preserved.  Metacarpals intact.  Questionable remote/healed fracture at the proximal fifth metacarpal.  No acute fracture identified.  IMPRESSION: No acute fracture or dislocation identified about the left wrist.   Original Report Authenticated By: Erskine Speed, M.D.   Dg Knee 1-2 Views Right  06/07/2012   *RADIOLOGY REPORT*  Clinical Data: Knee pain and swelling.  RIGHT KNEE - 1-2 VIEW  Comparison: None.  Findings: No evidence of fracture or dislocation.  No evidence of knee joint effusion.  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  No other significant bone abnormality identified.  IMPRESSION: Moderate tricompartmental osteoarthritis.   Original Report Authenticated By: Myles Rosenthal, M.D.   US Renal  06/08/2012   *RADIOLOGY REPORT*  Clinical Data: Renal failure, hypertension  RENAL/URINARY TRACT ULTRASOUND COMPLETE  Comparison:  None  Findings:  Right Kidney:  11.6 cm length.  Normal cortical thickness. Increased cortical echogenicity.  No hydronephrosis or shadowing calcification.  Single small nonshadowing echogenic focus at mid right kidney, nonspecific.  Small cyst at upper pole 12 x 11 x 12 mm. No solid mass identified.  Left Kidney:  11.1 cm length.  Normal cortical thickness. Diffusely increased cortical echogenicity.  Less well visualized than right kidney due to body habitus and poor acoustic window.  No gross mass, hydronephrosis or shadowing calcification.  No perinephric fluid.  Bladder:  Only partially distended, grossly unremarkable.  IMPRESSION: Medical renal disease changes of both kidneys. Small right renal cyst 12 mm diameter. No evidence of solid mass or hydronephrosis.   Original Report Authenticated By: Ulyses Southward, M.D.    Medications: Scheduled Meds: . amLODipine  10 mg Oral QHS  . colchicine  0.6 mg Oral TID  . enoxaparin (LOVENOX) injection  40 mg Subcutaneous Q24H  . furosemide  40 mg Oral BID  . hydrALAZINE  25 mg Oral  Q8H  . predniSONE  60 mg Oral Q breakfast  . sodium chloride  3 mL Intravenous Q12H  . sodium chloride  3 mL Intravenous Q12H      LOS: 3 days   Josclyn Rosales M.D. [facility] 06/09/2012, 10:49 AM Pager: [phone]  If 7PM-7AM, please contact night-coverage [email or web address] Password [email or web address]

## 2012-06-10 LAB — PROTEIN ELECTROPHORESIS, SERUM
Beta 2: 7.3 % — ABNORMAL HIGH (ref 3.2–6.5)
Gamma Globulin: 14.6 % (ref 11.1–18.8)
M-Spike, %: NOT DETECTED g/dL

## 2012-06-10 MED ORDER — FERROUS SULFATE 325 (65 FE) MG PO TBEC: 325.0000 mg | DELAYED_RELEASE_TABLET | Freq: Three times a day (TID) | ORAL | Status: DC

## 2012-06-10 MED ORDER — AMLODIPINE BESYLATE 10 MG PO TABS: 10.0000 mg | ORAL_TABLET | Freq: Every day | ORAL | Status: DC

## 2012-06-10 MED ORDER — HYDRALAZINE HCL 50 MG PO TABS
50.0000 mg | ORAL_TABLET | Freq: Three times a day (TID) | ORAL | Status: DC
  Administered 2012-06-10: 50 mg via ORAL
  Filled 2012-06-10 (×2): qty 1

## 2012-06-10 MED ORDER — FUROSEMIDE 40 MG PO TABS: 40.0000 mg | ORAL_TABLET | Freq: Two times a day (BID) | ORAL | Status: DC

## 2012-06-10 MED ORDER — ALUM & MAG HYDROXIDE-SIMETH 200-200-20 MG/5ML PO SUSP
30.0000 mL | Freq: Four times a day (QID) | ORAL | Status: DC | PRN
  Administered 2012-06-10: 30 mL via ORAL
  Filled 2012-06-10: qty 30

## 2012-06-10 MED ORDER — HYDRALAZINE HCL 50 MG PO TABS
50.0000 mg | ORAL_TABLET | Freq: Three times a day (TID) | ORAL | Status: DC
  Filled 2012-06-10 (×2): qty 1

## 2012-06-10 MED ORDER — HYDRALAZINE HCL 50 MG PO TABS: 50.0000 mg | ORAL_TABLET | Freq: Three times a day (TID) | ORAL | Status: DC

## 2012-06-10 MED ORDER — PREDNISONE 10 MG PO TABS: ORAL_TABLET | ORAL | Status: DC

## 2012-06-10 MED ORDER — CARVEDILOL 3.125 MG PO TABS: 3.1250 mg | ORAL_TABLET | Freq: Two times a day (BID) | ORAL | Status: DC

## 2012-06-10 NOTE — Progress Notes (Signed)
Orthopedic Tech Progress Note Patient Details:  Harold Garza Oct 02, 1960 [id number] Arm sling applied to Left UE. Tolerated well.  Ortho Devices Type of Ortho Device: Arm sling Ortho Device/Splint Location: Left Ortho Device/Splint Interventions: Application   Asia R Thompson 06/10/2012, 3:24 PM

## 2012-06-10 NOTE — Discharge Summary (Addendum)
Physician Discharge Summary  Harold Garza XBJ:[id number] DOB: [birth year] DOA: 06/06/2012  PCP: No primary provider on file.  Admit date: 06/06/2012 Discharge date: 06/10/2012  Time spent: 35 minutes  Recommendations for Outpatient Follow-up:  1. Follow up with PCP 2-4 week, check his Bp titrate Bp as tolerate it 2. Follow up on SPEP and UPEP as an outpatient. 3. Refer to nephrology 4. Will also need a colonoscopy as an outpatient.  Discharge Diagnoses:  Principal Problem:   CHF (congestive heart failure) Active Problems:   Malignant hypertension   Hyperlipidemia   CKD (chronic kidney disease)   Microcytic anemia   Left wrist pain   Discharge Condition: stable  Diet recommendation: heart healthy  Filed Weights   06/08/12 0445 06/09/12 0500 06/10/12 0500  Weight: 116.121 kg (256 lb) 115.214 kg (254 lb) 115.078 kg (253 lb 11.2 oz)    History of present illness:  52 y.o. male with history of hypertension who has not been taking his medications for last few years due to financial reasons presents to the ER at [facility] with complaints of left wrist pain. Patient has been having left wrist pain since morning and denies any trauma. Patient in addition also has right knee pain for last few weeks. Patient in the ER was found to have mild lower extremity edema and on further questioning patient states that over the last 3-4 weeks patient has been having exertional shortness of breath. Patient pressure also was found to be elevated. Chest x-ray showed cardiomegaly. Patient was given Lasix 40 mg by mouth and clonidine and hydralazine IV. Patient has been admitted for CHF. Patient denies any chest pain productive cough fever chills nausea vomiting abdominal pain. Patient states he has had similar symptoms of CHF 4-5 years ago. Denies having any cardiac catheter.   Hospital Course:  Left wrist arthritis  - most likely gout attack - uric acid 9.7 , clinical picture  contractible. - started on empiric prednisone and improved. Will go a home on steroids. - Started colchicine TID on 06/07/12 - prednisone 50 mg po given 06/08/12, I will give prednisone 60mg  today, assess for improvement - Dr. Mina Marble (hand surgery) evaluated the patient on 5/12 and recommended medical management.  - Left wrist Xray negative for any fracture or dislocation.  CKD: - Probably hypertensive nephropathy - urinalysis positive for proteinuria, F/U SPEP, UPEP as an outpatient. - renal ultrasound shows medical renal disease for both kidneys, no solid mass or hydronephrosis. - Cr improving today, but will need outpatient follow-up with nephrology, will difer to PCP.  HTN : - Uncontrolled on admission due to non compliance. - started on po meds on admission, increased hydralazine to 50mg  TID, continue Lasix.  - added coreg. - echo showed 5.15.2014: Systolic function was normal. The estimated ejection fraction was in the range of 55% to 60%. Wall motion was normal; there were no regional wall motion abnormalities. Left ventricular diastolic function parameters were normal.  Microcytic anemia: - ferritin of 10 started on ferrous sulfate. - no melanotic stools in house. - will need referal for a colonoscopy as an outpatient.  Procedures:  Echo  Lower ext doppler 5.14.2014  Consultations:  Dr. Mina Marble  Discharge Exam: Filed Vitals:   06/09/12 2300 06/10/12 0500 06/10/12 0638 06/10/12 1415  BP: 155/92 170/111 165/113 187/116  Pulse:  83  90  Temp:  98 F (36.7 C)  98.2 F (36.8 C)  TempSrc:  Oral  Oral  Resp:  20  18  Height:  Weight:  115.078 kg (253 lb 11.2 oz)    SpO2:  98%  97%    General: A&O x3 Cardiovascular: RRR Respiratory: good air movement CTA B/L  Discharge Instructions      Discharge Orders   Future Appointments Provider Department Dept Phone   06/15/2012 10:30 AM [facility] Covering Provider [facility]  [phone]   Future Orders Complete By Expires     Diet - low sodium heart healthy  As directed     Increase activity slowly  As directed         Medication List    TAKE these medications       amLODipine 10 MG tablet  Commonly known as:  NORVASC  Take 1 tablet (10 mg total) by mouth at bedtime.     carvedilol 3.125 MG tablet  Commonly known as:  COREG  Take 1 tablet (3.125 mg total) by mouth 2 (two) times daily with a meal.     ferrous sulfate 325 (65 FE) MG EC tablet  Take 1 tablet (325 mg total) by mouth 3 (three) times daily with meals.     furosemide 40 MG tablet  Commonly known as:  LASIX  Take 1 tablet (40 mg total) by mouth 2 (two) times daily.     hydrALAZINE 50 MG tablet  Commonly known as:  APRESOLINE  Take 1 tablet (50 mg total) by mouth every 8 (eight) hours.     ibuprofen 200 MG tablet  Commonly known as:  ADVIL,MOTRIN  Take 400 mg by mouth every 6 (six) hours as needed for pain.     predniSONE 10 MG tablet  Commonly known as:  DELTASONE  Takes 6 tablets for  3 days, then 5 tablets for 1 days, then 4 tablets for 1 days, then 3 tablets for 1 days, then 2 tabs for 1 days, then 1 tab for 1 days, and then stop.       No Known Allergies Follow-up Information   Follow up with [facility]     On 06/15/2012. ( @10 :30 please bring d/c summary and medication list. You will be able to apply for the orange card for medication assistance. You will receive a packet of information once you get there. )    Contact information:   [address] [place] [address] Dr. Jerral Ralph [phone]       The results of significant diagnostics from this hospitalization (including imaging, microbiology, ancillary and laboratory) are listed below for reference.    Significant Diagnostic Studies: Dg Chest 2 View  06/06/2012   *RADIOLOGY REPORT*  Clinical Data: Left forearm pain, history of hypertension  CHEST - 2 VIEW  Comparison: None.   Findings: Mild cardiomegaly noted with ascending aortic ectasia and unfolding.  The lungs are clear.  Right hemidiaphragmatic eventration with presumed right basilar adjacent scarring atelectasis noted.  No pleural effusion.  No acute osseous finding.  IMPRESSION: Cardiomegaly and aortic ectasia.   Original Report Authenticated By: Christiana Pellant, M.D.   Dg Wrist 2 Views Left  06/07/2012   *RADIOLOGY REPORT*  Clinical Data: 52 year old male pain.  Swelling.  LEFT WRIST - 2 VIEW  Comparison: None.  Findings: Bone mineralization is within normal limits.  Distal radius and ulna, intact.  Carpal bone alignment preserved.  Joint spaces preserved.  Metacarpals intact.  Questionable remote/healed fracture at the proximal fifth metacarpal.  No acute fracture identified.  IMPRESSION: No acute fracture or dislocation identified about  the left wrist.   Original Report Authenticated By: Erskine Speed, M.D.   Dg Knee 1-2 Views Right  06/07/2012   *RADIOLOGY REPORT*  Clinical Data: Knee pain and swelling.  RIGHT KNEE - 1-2 VIEW  Comparison: None.  Findings: No evidence of fracture or dislocation.  No evidence of knee joint effusion.  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  No other significant bone abnormality identified.  IMPRESSION: Moderate tricompartmental osteoarthritis.   Original Report Authenticated By: Myles Rosenthal, M.D.   US Renal  06/08/2012   *RADIOLOGY REPORT*  Clinical Data: Renal failure, hypertension  RENAL/URINARY TRACT ULTRASOUND COMPLETE  Comparison:  None  Findings:  Right Kidney:  11.6 cm length.  Normal cortical thickness. Increased cortical echogenicity.  No hydronephrosis or shadowing calcification.  Single small nonshadowing echogenic focus at mid right kidney, nonspecific.  Small cyst at upper pole 12 x 11 x 12 mm. No solid mass identified.  Left Kidney:  11.1 cm length.  Normal cortical thickness. Diffusely increased cortical echogenicity.  Less well visualized than right kidney due to  body habitus and poor acoustic window.  No gross mass, hydronephrosis or shadowing calcification.  No perinephric fluid.  Bladder:  Only partially distended, grossly unremarkable.  IMPRESSION: Medical renal disease changes of both kidneys. Small right renal cyst 12 mm diameter. No evidence of solid mass or hydronephrosis.   Original Report Authenticated By: Ulyses Southward, M.D.    Microbiology: No results found for this or any previous visit (from the past 240 hour(s)).   Labs: Basic Metabolic Panel:  Recent Labs Lab 06/06/12 1315 06/06/12 2156 06/07/12 0500 06/08/12 0515 06/09/12 0430  NA 140  --  139 138 137  K 3.7  --  3.1* 3.2* 3.6  CL 104  --  104 101 102  CO2 24  --  27 23 24   GLUCOSE 113*  --  119* 113* 118*  BUN 18  --  15 16 23   CREATININE 1.60* 1.52* 1.45* 1.73* 1.61*  CALCIUM 9.6  --  9.0 9.2 9.6   Liver Function Tests:  Recent Labs Lab 06/06/12 1315  AST 26  ALT 26  ALKPHOS 56  BILITOT 0.4  PROT 7.1  ALBUMIN 3.7   No results found for this basename: LIPASE, AMYLASE,  in the last 168 hours No results found for this basename: AMMONIA,  in the last 168 hours CBC:  Recent Labs Lab 06/06/12 1315 06/06/12 2156 06/07/12 0500 06/08/12 0515  WBC 7.8 9.2 9.0 10.8*  NEUTROABS 5.2  --   --   --   HGB 12.6* 12.8* 12.3* 12.6*  HCT 35.1* 35.8* 35.4* 36.0*  MCV 77.3* 76.7* 77.8* 77.3*  PLT 225 230 221 223   Cardiac Enzymes:  Recent Labs Lab 06/06/12 1315 06/06/12 2156  TROPONINI <0.30 <0.30   BNP: BNP (last 3 results)  Recent Labs  06/06/12 1315  PROBNP 990.2*   CBG: No results found for this basename: GLUCAP,  in the last 168 hours     Signed:  Marinda Elk  [facility] 06/10/2012, 3:10 PM

## 2012-06-15 ENCOUNTER — Ambulatory Visit: Payer: Self-pay

## 2012-06-16 ENCOUNTER — Telehealth: Payer: Self-pay | Admitting: General Practice

## 2012-06-23 ENCOUNTER — Encounter: Payer: Self-pay | Admitting: Family Medicine

## 2012-06-23 ENCOUNTER — Ambulatory Visit: Payer: Medicaid Other | Attending: Family Medicine | Admitting: Family Medicine

## 2012-06-23 VITALS — BP 162/91 | HR 84 | Temp 99.1°F | Resp 19 | Ht 70.0 in | Wt 262.0 lb

## 2012-06-23 DIAGNOSIS — M25532 Pain in left wrist: Secondary | ICD-10-CM

## 2012-06-23 DIAGNOSIS — N189 Chronic kidney disease, unspecified: Secondary | ICD-10-CM | POA: Insufficient documentation

## 2012-06-23 DIAGNOSIS — I129 Hypertensive chronic kidney disease with stage 1 through stage 4 chronic kidney disease, or unspecified chronic kidney disease: Secondary | ICD-10-CM | POA: Insufficient documentation

## 2012-06-23 DIAGNOSIS — M25539 Pain in unspecified wrist: Secondary | ICD-10-CM | POA: Insufficient documentation

## 2012-06-23 DIAGNOSIS — D649 Anemia, unspecified: Secondary | ICD-10-CM | POA: Insufficient documentation

## 2012-06-23 DIAGNOSIS — I10 Essential (primary) hypertension: Secondary | ICD-10-CM

## 2012-06-23 LAB — BASIC METABOLIC PANEL
BUN: 24 mg/dL — ABNORMAL HIGH (ref 6–23)
CO2: 28 mEq/L (ref 19–32)
Calcium: 9.2 mg/dL (ref 8.4–10.5)
Creat: 1.66 mg/dL — ABNORMAL HIGH (ref 0.50–1.35)
Glucose, Bld: 111 mg/dL — ABNORMAL HIGH (ref 70–99)
Sodium: 139 mEq/L (ref 135–145)

## 2012-06-23 MED ORDER — CARVEDILOL 3.125 MG PO TABS: 6.2500 mg | ORAL_TABLET | Freq: Two times a day (BID) | ORAL | Status: DC

## 2012-06-23 MED ORDER — HYDRALAZINE HCL 50 MG PO TABS: 50.0000 mg | ORAL_TABLET | Freq: Three times a day (TID) | ORAL | Status: DC

## 2012-06-23 MED ORDER — FUROSEMIDE 40 MG PO TABS: 40.0000 mg | ORAL_TABLET | Freq: Two times a day (BID) | ORAL | Status: DC

## 2012-06-23 NOTE — Progress Notes (Signed)
Subjective:     Patient ID: Harold Garza, male   DOB: 05/25/1960, 52 y.o.   MRN: [id number]  HPI Pt here to establish care and for hosp f/u.   Chart reviewed, hospital notes reviewed.   Pt says he is feeling better than when admitted to hospital, but still more tired than usual.   He is most concerned about his left wrist, as it still hurts and is swollen and he is unable to do his work. It aches, has decreased rom, and his grip strength is decreased. In the hospital it was thought that the pain was from gout and he wast started on colchicine as well as given a steroid taper. He does not feel either medication has helped.   He has a history of CKD and saw nephro in the past, but has not for over a year.   He has a h/o HTN and states he has been taking his BP meds as prescribed.   He had sx of CHF in the hospital and was found to have severe LV concentric hypertrophy. He is taking lasix and now has minimal edema and no shob.      Review of Systems per hpi      Objective:   Physical Exam  Nursing note and vitals reviewed. Constitutional: He appears well-developed and well-nourished.  Cardiovascular: Normal rate, regular rhythm and normal heart sounds.   Pulmonary/Chest: Effort normal and breath sounds normal.  Abdominal: Soft. Bowel sounds are normal.  Skin: Skin is warm and dry.  Psychiatric: He has a normal mood and affect.       Assessment:     Chronic renal disease, unspecified stage - Plan: Ambulatory referral to Nephrology, Basic Metabolic Panel  Anemia - Plan: Ambulatory referral to Gastroenterology  Wrist pain, acute, left - Plan: Ambulatory referral to Orthopedic Surgery  Essential hypertension, benign - Plan: carvedilol (COREG) 3.125 MG tablet, furosemide (LASIX) 40 MG tablet, hydrALAZINE (APRESOLINE) 50 MG tablet, Basic Metabolic Panel       Plan:     Meds, labs, and referrals as above  F/u:  - 1 week for nurse visit BP check - 1-2 mos for 30 min  visit f/u on BP, h/o hyperglycemia, tobacco  Call with questions or concerns.

## 2012-06-23 NOTE — Progress Notes (Signed)
Patient's nurse case worker wanted patient to have blood pressure controlled here, per patient.  I'm unsure what patient's main concern is as he states blood pressure and care worker states appointment for gout of left wrist.

## 2012-06-23 NOTE — Patient Instructions (Signed)

## 2012-06-24 ENCOUNTER — Encounter: Payer: Self-pay | Admitting: Family Medicine

## 2012-06-25 ENCOUNTER — Telehealth: Payer: Self-pay | Admitting: Family Medicine

## 2012-06-25 NOTE — Telephone Encounter (Signed)
Pt called about referral appt.

## 2012-06-28 NOTE — Telephone Encounter (Signed)
Pt scheduled appt

## 2012-06-29 ENCOUNTER — Ambulatory Visit: Payer: Self-pay | Attending: Family Medicine | Admitting: *Deleted

## 2012-06-29 NOTE — Progress Notes (Unsigned)
Patient ID: Harold Garza, male   DOB: [birth year], 52 y.o.   MRN: [id number] Patient presents for BP check. Pt stated he had not started taking new hypertension yet. Pt stated he was going to get prescription filled today. I instructed patient to return next week for another BP check on Thursday 07/08/12.

## 2012-07-07 ENCOUNTER — Ambulatory Visit: Payer: Self-pay | Admitting: Sports Medicine

## 2012-07-08 ENCOUNTER — Ambulatory Visit: Payer: Self-pay | Attending: Family Medicine

## 2012-07-08 VITALS — BP 160/80 | HR 48

## 2012-07-08 DIAGNOSIS — I10 Essential (primary) hypertension: Secondary | ICD-10-CM

## 2012-07-08 MED ORDER — HYDRALAZINE HCL 50 MG PO TABS: 100.0000 mg | ORAL_TABLET | Freq: Three times a day (TID) | ORAL | Status: DC

## 2012-07-08 NOTE — Progress Notes (Unsigned)
Patient ID: Harold Garza, male   DOB: December 17, 1960, 52 y.o.   MRN: [id number]  Patient was here just for a followup blood pressure check, he was not a formal checked in patient, heart rate was in mid 40s, blood pressure was 160/90, it has been elevated persistently, he also has chronic kidney disease stage IV and needs tight blood pressure monitoring, I have increased his hydralazine from 50 3 times a day 200 3 times a day, I have requested the staff to get the patient back in within a week to get blood pressure recheck.

## 2012-07-08 NOTE — Progress Notes (Unsigned)
Patient ID: Harold Garza, male   DOB: May 05, 1960, 52 y.o.   MRN: [id number] Patient presents for BP check; BP was 160/80; Dr. Thedore Mins changed medication; and scheduled patient return in one week for recheck.

## 2012-07-14 ENCOUNTER — Ambulatory Visit: Payer: Self-pay | Attending: Family Medicine | Admitting: Internal Medicine

## 2012-07-14 ENCOUNTER — Ambulatory Visit (INDEPENDENT_AMBULATORY_CARE_PROVIDER_SITE_OTHER): Payer: Self-pay | Admitting: Sports Medicine

## 2012-07-14 VITALS — BP 134/80 | Ht 72.0 in | Wt 254.0 lb

## 2012-07-14 DIAGNOSIS — M25432 Effusion, left wrist: Secondary | ICD-10-CM

## 2012-07-14 DIAGNOSIS — M25532 Pain in left wrist: Secondary | ICD-10-CM

## 2012-07-14 DIAGNOSIS — M25539 Pain in unspecified wrist: Secondary | ICD-10-CM

## 2012-07-14 DIAGNOSIS — M25439 Effusion, unspecified wrist: Secondary | ICD-10-CM

## 2012-07-14 MED ORDER — TRAMADOL HCL 50 MG PO TABS: 100.0000 mg | ORAL_TABLET | Freq: Three times a day (TID) | ORAL | Status: DC | PRN

## 2012-07-14 MED ORDER — PREDNISONE 10 MG PO TABS: 10.0000 mg | ORAL_TABLET | Freq: Every day | ORAL | Status: DC

## 2012-07-14 NOTE — Progress Notes (Unsigned)
Patient ID: Harold Garza, male   DOB: 02/20/1960, 52 y.o.   MRN: [id number]  Patient has a blood pressure check Blood pressure 148 systolic. Hydralazine recently increased to 200 mg 3 times a day Patient is also more rest The patient will followup in a month for routine followup with the

## 2012-07-14 NOTE — Progress Notes (Signed)
  Subjective:    Patient ID: Harold Garza, male    DOB: [birth year], 52 y.o.   MRN: [id number] Chief Complaint:  Left wrist pain HPI 52 yo right handed male with h/o CHF, CKD and HTN who presents as new patient for evaluation of left wrist pain.  Pain started on palmar aspect of wrist 2 months ago (april 2014) while he was pushing off from a chair to stand up. The pain started at the time and a few weeks later, he started developing swelling. The pain and swelling became so severe that he went to the ED in May. There, he had plain film of his hand and wrist which did not show any acute injury. A uric acid was elevated at 9.7.  He was seen by Dr. Mina Marble with orthopedic surgery who recommended medical management thought to be from gout attack. He was fitted with a sling which has helped.  He took prednisone taper and ibuprofen. He doesn't remember taking colcrys. He did not notice significant improvement, in fact he thinks pain is now worst since his ED visit.   The pain is located on the dorsal aspect of the wrist, associated with significant swelling. He can barely moe his wrist secondary to pain. Pain is a constant ache, worst with pressure on the area, worst with movement.    Review of Systems Negative except per HPI  Medical History reviewed and significant for CHF and CKD with a Cr of 1.66 on 06/23/12.  Medications and allergies reviewed.  Social History reviewed.     Objective:   Physical Exam  Filed Vitals:   07/14/12 1037  BP: 134/80   General: pleasant 52 year old male, in mild distress from pain Left wrist: diffuse swelling along wrist joint and dorsal aspect of hand, difficult to differentiate between soft tissue swelling vs effusion Warm to touch compared to right. No erythema.  Very limited wrist extension and flexion as well as ulnar and radial deviation Can move fingers but limited due to pain Tenderness along dorsal aspect of wrist on ulnar side around TFCC, no  scaphoid tenderness.    Ultrasound: Disrupted synovium in wrist joint along with hypoechoic changes consistent with joint effusion  Left Wrist 2 view xray 06/07/12 Findings: Bone mineralization is within normal limits. Distal  radius and ulna, intact. Carpal bone alignment preserved. Joint  spaces preserved. Metacarpals intact. Questionable remote/healed  fracture at the proximal fifth metacarpal. No acute fracture  identified.  IMPRESSION:  No acute fracture or dislocation identified about the left wrist.      Assessment & Plan:  52 yo male with 2 month history of left wrist pain and swelling found to have significant joint effusion on ultrasound.  Differential includes gout although given long duration and failed treatment with prednisone this seems a little less likely. Potential for TFCC disruption.  - ideally would get MRI although will need to wait until patient is approved for medicaid at end of June - start prednisone taper, with longer taper than initially given with hope that this will help.  - tramadol for pain - ace bandage the wrist for reduction of swelling.  -Followup in 2 weeks. Hopefully swelling will have resolved to the point that we can get a better clinical exam.

## 2012-07-14 NOTE — Progress Notes (Unsigned)
Patient here for blood pressure check History of gout

## 2012-07-14 NOTE — Patient Instructions (Addendum)
For the prednisone, this is how you take it: Take 60mg  for days 1 and 2. Take 50mg  for days 3 and 4, take 40mg  for days 5 and 6, take 30mg  for days 7 and 8, take 20mg  for days 9 and 10, take 10mg  for days 11 and 12.   Follow up in 2 weeks.

## 2012-07-23 ENCOUNTER — Ambulatory Visit: Payer: Self-pay

## 2012-07-29 ENCOUNTER — Ambulatory Visit: Payer: Self-pay | Admitting: Sports Medicine

## 2012-07-29 ENCOUNTER — Ambulatory Visit (INDEPENDENT_AMBULATORY_CARE_PROVIDER_SITE_OTHER): Payer: Self-pay | Admitting: Sports Medicine

## 2012-07-29 ENCOUNTER — Encounter: Payer: Self-pay | Admitting: Sports Medicine

## 2012-07-29 VITALS — BP 164/100 | HR 65 | Ht 72.0 in | Wt 254.0 lb

## 2012-07-29 DIAGNOSIS — M25539 Pain in unspecified wrist: Secondary | ICD-10-CM

## 2012-07-29 DIAGNOSIS — M25532 Pain in left wrist: Secondary | ICD-10-CM

## 2012-07-29 NOTE — Progress Notes (Signed)
  Subjective:    Patient ID: Harold Garza, male    DOB: [birth year], 52 y.o.   MRN: [id number]  HPI Patient comes in today for followup on left wrist swelling. Swelling is much improved after completing a 12 day steroid Dosepak. However, he still has some residual swelling and pain.    Review of Systems     Objective:   Physical Exam Well-developed, well-nourished. No acute distress  Left wrist: Patient still has limited wrist mobility in all planes. He is diffusely tender to palpation across the dorsum of the wrist and has a trace wrist effusion. Skin is not erythematous and not warm to touch. He is neurovascularly intact distally.  MSK ultrasound of the left wrist still shows a small joint effusion but it is much improved when compared to his previous ultrasound.       Assessment & Plan:  1. Improving left wrist pain likely secondary to gouty attack  Although the patient's symptoms have improved they have not resolved. He does have a history of a remote trauma prior to his symptoms. He would like to elicit the input of a hand specialist. Therefore, I will refer the patient to Dr. Janee Garza for further workup if he feels this to be necessary. In the meantime, patient is instructed to contact me if his swelling begins to once again worsen. I would consider repeating a 12 day Sterapred Dosepak. If Dr. Janee Garza feels that this is likely due to gout then the decision to prophylactically treat this patient will be left up to his PCP. Alternatively, his PCP could refer him to rheumatology. Followup with me when necessary.

## 2012-08-02 ENCOUNTER — Telehealth: Payer: Self-pay | Admitting: Family Medicine

## 2012-08-02 NOTE — Telephone Encounter (Signed)
Pt calling about referral.  Says he is scheduled for 7/17 with orthopedic and was told he needed a referral to go there, pt asking if referral was sent. Please f/u with pt.

## 2012-08-04 ENCOUNTER — Ambulatory Visit: Payer: Medicaid Other | Attending: Family Medicine | Admitting: Internal Medicine

## 2012-08-04 ENCOUNTER — Encounter: Payer: Self-pay | Admitting: Internal Medicine

## 2012-08-04 VITALS — BP 174/121 | HR 64 | Temp 99.2°F | Resp 18 | Ht 72.0 in | Wt 251.0 lb

## 2012-08-04 DIAGNOSIS — I10 Essential (primary) hypertension: Secondary | ICD-10-CM

## 2012-08-04 MED ORDER — FERROUS SULFATE 325 (65 FE) MG PO TBEC: 325.0000 mg | DELAYED_RELEASE_TABLET | Freq: Three times a day (TID) | ORAL | Status: AC

## 2012-08-04 MED ORDER — CARVEDILOL 3.125 MG PO TABS: 6.2500 mg | ORAL_TABLET | Freq: Two times a day (BID) | ORAL | Status: DC

## 2012-08-04 MED ORDER — HYDRALAZINE HCL 50 MG PO TABS: 100.0000 mg | ORAL_TABLET | Freq: Three times a day (TID) | ORAL | Status: DC

## 2012-08-04 MED ORDER — AMLODIPINE BESYLATE 10 MG PO TABS: 10.0000 mg | ORAL_TABLET | Freq: Every day | ORAL | Status: DC

## 2012-08-04 MED ORDER — CLONIDINE HCL 0.1 MG PO TABS: 0.2000 mg | ORAL_TABLET | Freq: Once | ORAL | Status: DC

## 2012-08-04 NOTE — Progress Notes (Signed)
Patient ID: Harold Garza, male   DOB: 29-Nov-1960, 52 y.o.   MRN: [id number] Patient Demographics  Harold Garza, is a 52 y.o. male  JXB:[id number]  ZHY:[id number]  DOB - [birth year]  Chief Complaint  Patient presents with  . Medication Refill        Subjective:   Harold Garza with History of hypertension, CKD-3, iron deficiency anemia is here for medication refill, he has run out of his medications a few weeks ago, he is completely symptom free.  Denies any subjective complaints except as above, no active headache, no chest abdominal pain at this time, not short of breath. No focal weakness which is new.   Objective:    Patient Active Problem List   Diagnosis Date Noted  . Left wrist pain 06/07/2012  . CHF (congestive heart failure) 06/06/2012  . CKD (chronic kidney disease) 06/06/2012  . Left arm swelling 06/06/2012  . Microcytic anemia 06/06/2012  . Malignant hypertension   . Hyperlipidemia      Filed Vitals:   08/04/12 1030  BP: 174/121  Pulse: 64  Temp: 99.2 F (37.3 C)  TempSrc: Oral  Resp: 18  Height: 6' (1.829 m)  Weight: 251 lb (113.853 kg)  SpO2: 100%     Exam  Awake Alert, Oriented X 3, No new F.N deficits, Normal affect [place].AT,PERRAL Supple Neck,No JVD, No cervical lymphadenopathy appriciated.  Symmetrical Chest wall movement, Good air movement bilaterally, CTAB RRR,No Gallops,Rubs or new Murmurs, No Parasternal Heave +ve B.Sounds, Abd Soft, Non tender, No organomegaly appriciated, No rebound - guarding or rigidity. No Cyanosis, Clubbing or edema, No new Rash or bruise       Data Review   CBC No results found for this basename: WBC, HGB, HCT, PLT, MCV, MCH, MCHC, RDW, NEUTRABS, LYMPHSABS, MONOABS, EOSABS, BASOSABS, BANDABS, BANDSABD,  in the last 168 hours  Chemistries   No results found for this basename: NA, K, CL, CO2, GLUCOSE, BUN, CREATININE, GFRCGP, CALCIUM, MG, AST, ALT, ALKPHOS, BILITOT,  in the last 168  hours ------------------------------------------------------------------------------------------------------------------ No results found for this basename: HGBA1C,  in the last 72 hours ------------------------------------------------------------------------------------------------------------------ No results found for this basename: CHOL, HDL, LDLCALC, TRIG, CHOLHDL, LDLDIRECT,  in the last 72 hours ------------------------------------------------------------------------------------------------------------------ No results found for this basename: TSH, T4TOTAL, FREET3, T3FREE, THYROIDAB,  in the last 72 hours ------------------------------------------------------------------------------------------------------------------ No results found for this basename: VITAMINB12, FOLATE, FERRITIN, TIBC, IRON, RETICCTPCT,  in the last 72 hours  Coagulation profile  No results found for this basename: INR, PROTIME,  in the last 168 hours     Prior to Admission medications   Medication Sig Start Date End Date Taking? Authorizing Provider  amLODipine (NORVASC) 10 MG tablet Take 1 tablet (10 mg total) by mouth at bedtime. 08/04/12   Leroy Sea, MD  carvedilol (COREG) 3.125 MG tablet Take 2 tablets (6.25 mg total) by mouth 2 (two) times daily with a meal. 08/04/12   Leroy Sea, MD  ferrous sulfate 325 (65 FE) MG EC tablet Take 1 tablet (325 mg total) by mouth 3 (three) times daily with meals. 08/04/12   Leroy Sea, MD  hydrALAZINE (APRESOLINE) 50 MG tablet Take 2 tablets (100 mg total) by mouth every 8 (eight) hours. 08/04/12   Leroy Sea, MD     Assessment & Plan   1. Hypertension in poor control as he ran out of his medications a few weeks ago, prescriptions were refilled, he is symptom free. He will come back in a month to  followup on his blood pressure. He will get Catapres 0.2 mg by mouth x1 here before he leaves.   2. History of iron deficiency anemia patient provided with iron  supplementations. Next visit we will check his CBC. He has a scheduled appointment with [facility] for routine colonoscopy .   3.CKD-3 - has renal appointment in 2 weeks.    Leroy Sea M.D on 08/04/2012 at 10:34 AM

## 2012-08-04 NOTE — Progress Notes (Signed)
Pt here for rx refill of BP meds. States he ran out 2 weeks ago. Denies cp,dizziness or h/a

## 2012-08-06 ENCOUNTER — Telehealth: Payer: Self-pay | Admitting: Family Medicine

## 2012-08-06 NOTE — Telephone Encounter (Signed)
Script for cloNIDine (CATAPRES) 0.1 MG tablet, for 90 tablets says to take once, does not specify daily.  Pharmacy would like to confirm dosage for patient. Please call [facility] at [phone].

## 2012-08-06 NOTE — Telephone Encounter (Signed)
Clarify medication?

## 2012-08-10 NOTE — Telephone Encounter (Signed)
I spoke to patient and she is aware that don't need referral because she has straight medicaid.

## 2012-08-12 ENCOUNTER — Telehealth: Payer: Self-pay | Admitting: Family Medicine

## 2012-08-12 NOTE — Telephone Encounter (Signed)
Pharmacist calling to clarify directions for on cloNIDine (CATAPRES) 0.1 MG tablet written on 08/04/12. Please call back to clarify.

## 2012-08-13 ENCOUNTER — Telehealth: Payer: Self-pay | Admitting: *Deleted

## 2012-08-13 ENCOUNTER — Ambulatory Visit: Payer: Self-pay

## 2012-08-13 NOTE — Telephone Encounter (Signed)
08/13/12 Spoke  With pharmacist regarding clarification of Clonidine 0.1mg  tablets  That was ordered by  Dr. Susa Raring on 08/04/12.  P.Toddy Boyd,RN BSN MHA

## 2012-08-24 ENCOUNTER — Ambulatory Visit: Payer: Medicaid Other | Attending: Family Medicine | Admitting: Internal Medicine

## 2012-08-24 VITALS — BP 132/83 | HR 56 | Temp 98.2°F | Resp 16 | Wt 250.8 lb

## 2012-08-24 DIAGNOSIS — I10 Essential (primary) hypertension: Secondary | ICD-10-CM

## 2012-08-24 DIAGNOSIS — M109 Gout, unspecified: Secondary | ICD-10-CM

## 2012-08-24 MED ORDER — PREDNISONE 20 MG PO TABS: 60.0000 mg | ORAL_TABLET | Freq: Every day | ORAL | Status: DC

## 2012-08-24 NOTE — Progress Notes (Signed)
Patient states has history of gout to left wrist Today presents with pain to his great toe on right foot

## 2012-08-24 NOTE — Progress Notes (Signed)
Patient ID: Harold Garza, male   DOB: 1960-11-29, 52 y.o.   MRN: [id number]     [facility]za VHQ:[id number] DOB: 1960-06-02 DOA: (Not on file)   PCP: Harold Dakins, MD   Chief Complaint:  Pain over right great toe and left wrist since 3 days  HPI:  52 year old male who was recently seen in the clinic to refill his blood pressure medications, has a background history of uncontrolled hypertension, CKD stage 3 and admitted to [facility] in may for accelerated HTN and CHF during when he also had a gouty attack of his left wrist and was treated with a course of prednisone and colchicine. Patient comes in today with pain over his left wrist for almost a week and pain with swelling over his right great toe for past 3 days. Denies any trauma, denies any recent travel, insect bite, fevers, chills, nausea or vomiting. Denies any bowel or urinary symptoms. Denies use of NSAIDs. He reports difficulty with ambulation due to pain.   Review of Systems:  As outlined above   Past Medical History  Diagnosis Date  . Hypertension   . Hyperlipidemia    History reviewed. No pertinent past surgical history. Social History:  reports that he has been smoking.  He has never used smokeless tobacco. He reports that  drinks alcohol. He reports that he does not use illicit drugs.  No Known Allergies  Family History  Problem Relation Age of Onset  . Hypertension Mother     Prior to Admission medications   Medication Sig Start Date End Date Taking? Authorizing Provider  amLODipine (NORVASC) 10 MG tablet Take 1 tablet (10 mg total) by mouth at bedtime. 08/04/12   Harold Sea, MD  carvedilol (COREG) 3.125 MG tablet Take 2 tablets (6.25 mg total) by mouth 2 (two) times daily with a meal. 08/04/12   Harold Sea, MD  cloNIDine (CATAPRES) 0.1 MG tablet Take 2 tablets (0.2 mg total) by mouth once. 08/04/12   Harold Sea, MD  ferrous sulfate 325 (65 FE) MG EC tablet Take 1 tablet (325 mg total)  by mouth 3 (three) times daily with meals. 08/04/12   Harold Sea, MD  hydrALAZINE (APRESOLINE) 50 MG tablet Take 2 tablets (100 mg total) by mouth every 8 (eight) hours. 08/04/12   Harold Sea, MD    Physical Exam:  Filed Vitals:   08/24/12 0915  BP: 132/83  Pulse: 56  Temp: 98.2 F (36.8 C)  Resp: 16  Weight: 250 lb 12.8 oz (113.762 kg)  SpO2: 96%   Middle aged male in no acute distress HEENT: No pallor, moist oral mucosa Chest: Clear to auscultation bilaterally CVS: Normal S1 and S2 Extremities:warm, swollen right foot  mainly over the great toe with some warmth and tender to touch. varicose veins noted.  Mild swelling over left wrist with some painful ROM       Assessment/Plan Acute gouty arthritis Mainly involving right great toe and left wrist.  patient has CKD so cannot use NSAIDs. i will treat him with a course of oral prednisone 60 mg daily for 5 days. Counseled on avoiding alcohol ( reports drinking wine cooler occasionally and has significantly cut down on alcohol he was seen she was hospitalized in May). Counseled on minimizing red meat and diet rich in proteins.  HTN BP stable. Has been compliant with meds   Follow up in 1 week if symptoms not improved , otherwise can f/up in 6 months  [facility]za,  Har[facility]za    08/24/2012, 9:39 AM

## 2012-08-24 NOTE — Patient Instructions (Signed)
Arthritis - Gout Gout is caused by uric acid crystals forming in the joint. The big toe, foot, ankle, and knee are the joints most often affected.Often uric acid levels in the blood are also elevated. Symptoms develop rapidly, usually over hours. A joint fluid exam may be needed to prove the diagnosis. Acute gout episodes may follow a minor injury, surgery, illness or medication change. Gout occurs more often in men. It tends to be an inherited (passed down from a family member) condition. Your chance of having gout is increased if you take certain medications. These include water pills (diuretics), aspirin, niacin, and cyclosporine. Kidney disease and alcohol consumption may also increase your chances of getting gout. Months or years can pass between gout attacks.  Treatment includes ice, rest and raising the affected limb until the swelling and pain are better.Anti-inflammatory medicine usually brings about dramatic relief of pain, redness and swelling within 2 to 3 days. Other medications can also be effective. Increase your fluid intake. Do not drink alcohol or eat liver, sweetbreads, or sardines. Long-term gout management may require medicine to lower blood uric acid levels, or changing or stopping diuretics. Please see your caregiver if your condition is not better after 1 to 2 days of treatment.  SEEK IMMEDIATE MEDICAL CARE IF:  You have more serious symptoms such as a fever, skin rash, diarrhea, vomiting, or other joint pains. Document Released: 02/21/2004 Document Revised: 01/02/2011 Document Reviewed: 03/06/2008 [employer] Patient Information 2012 [employer]. 

## 2012-09-06 ENCOUNTER — Ambulatory Visit: Payer: Medicaid Other

## 2012-11-15 ENCOUNTER — Ambulatory Visit: Payer: Self-pay | Attending: Internal Medicine | Admitting: Internal Medicine

## 2012-11-15 VITALS — BP 155/98 | HR 79 | Temp 98.4°F | Resp 16

## 2012-11-15 DIAGNOSIS — Z789 Other specified health status: Secondary | ICD-10-CM

## 2012-11-15 DIAGNOSIS — D509 Iron deficiency anemia, unspecified: Secondary | ICD-10-CM

## 2012-11-15 DIAGNOSIS — I10 Essential (primary) hypertension: Secondary | ICD-10-CM | POA: Insufficient documentation

## 2012-11-15 DIAGNOSIS — Z23 Encounter for immunization: Secondary | ICD-10-CM | POA: Insufficient documentation

## 2012-11-15 DIAGNOSIS — M109 Gout, unspecified: Secondary | ICD-10-CM | POA: Insufficient documentation

## 2012-11-15 HISTORY — DX: Gout, unspecified: M10.9

## 2012-11-15 LAB — LIPID PANEL
HDL: 37 mg/dL — ABNORMAL LOW (ref >39)
LDL Cholesterol: 147 mg/dL — ABNORMAL HIGH (ref 0–99)
Total CHOL/HDL Ratio: 5.8 Ratio

## 2012-11-15 MED ORDER — ALLOPURINOL 100 MG PO TABS: 100.0000 mg | ORAL_TABLET | Freq: Every day | ORAL | Status: DC

## 2012-11-15 MED ORDER — PREDNISONE 20 MG PO TABS: 20.0000 mg | ORAL_TABLET | Freq: Every day | ORAL | Status: DC

## 2012-11-15 NOTE — Progress Notes (Signed)
Patient ID: Harold Garza, male   DOB: [birth year], 52 y.o.   MRN: [id number]   HPI: This is a 52 year old male with a past medical history of gout and hypertension. He presents with an acute gout attack in his left foot and left thumb/wrist area. He states that it has been going on for almost 2 weeks now. He has no other complaints.   No Known Allergies Past Medical History  Diagnosis Date  . Hypertension   . Hyperlipidemia   . Chronic diastolic CHF (congestive heart failure) 06/06/2012  . CKD (chronic kidney disease) stage 3, GFR 30-59 ml/min 06/06/2012  . Microcytic anemia 06/06/2012  . Gout 11/15/2012   Prior to Admission medications   Medication Sig Start Date End Date Taking? Authorizing Provider  amLODipine (NORVASC) 10 MG tablet Take 1 tablet (10 mg total) by mouth at bedtime. 08/04/12  Yes Leroy Sea, MD  cloNIDine (CATAPRES) 0.1 MG tablet Take 2 tablets (0.2 mg total) by mouth once. 08/04/12  Yes Leroy Sea, MD  carvedilol (COREG) 3.125 MG tablet Take 2 tablets (6.25 mg total) by mouth 2 (two) times daily with a meal. 08/04/12   Leroy Sea, MD  ferrous sulfate 325 (65 FE) MG EC tablet Take 1 tablet (325 mg total) by mouth 3 (three) times daily with meals. 08/04/12   Leroy Sea, MD  hydrALAZINE (APRESOLINE) 50 MG tablet Take 2 tablets (100 mg total) by mouth every 8 (eight) hours. 08/04/12   Leroy Sea, MD   Family History  Problem Relation Age of Onset  . Hypertension Mother    History   Social History  . Marital Status: Single    Spouse Name: N/A    Number of Children: N/A  . Years of Education: N/A   Occupational History  . Not on file.   Social History Main Topics  . Smoking status: Current Some Day Smoker  . Smokeless tobacco: Never Used  . Alcohol Use: Yes  . Drug Use: No  . Sexual Activity: Not on file   Other Topics Concern  . Not on file   Social History Narrative  . No narrative on file    Review of Systems  ______ Constitutional: Negative for fever, chills, diaphoresis, activity change, appetite change and fatigue. ____ HENT: Negative for ear pain, nosebleeds, congestion, facial swelling, rhinorrhea, neck pain, neck stiffness and ear discharge.  ____ Eyes: Negative for pain, discharge, redness, itching and visual disturbance. ____ Respiratory: Negative for cough, choking, chest tightness, shortness of breath, wheezing and stridor.  ____ Cardiovascular: Negative for chest pain, palpitations and leg swelling. ____ Gastrointestinal: Negative for Nausea/ Vomiting/ Diarrhea or Consitpation Genitourinary: Negative for dysuria, urgency, frequency, hematuria, flank pain, decreased urine volume, difficulty urinating and dyspareunia. ____ Musculoskeletal: Negative for back pain, joint swelling, arthralgias and gait problem. ________ Neurological: Negative for dizziness, tremors, seizures, syncope, facial asymmetry, speech difficulty, weakness, light-headedness, numbness and headaches. ____ Hematological: Negative for adenopathy. Does not bruise/bleed easily. ____ Psychiatric/Behavioral: Negative for hallucinations, behavioral problems, confusion, dysphoric mood, decreased concentration and agitation. ______   Objective:   Filed Vitals:   11/15/12 1029  BP: 155/98  Pulse:   Temp:   Resp:    There were no vitals filed for this visit.  Physical Exam ______ Constitutional: Appears well-developed and well-nourished. No distress. ____ HENT: Normocephalic. External right and left ear normal. Oropharynx is clear and moist. ____ Eyes: Conjunctivae and EOM are normal. PERRLA, no scleral icterus. ____ Neck: Normal ROM. Neck supple.  No JVD. No tracheal deviation. No thyromegaly. ____ CVS: RRR, S1/S2 +, no murmurs, no gallops, no carotid bruit.  Pulmonary: Effort and breath sounds normal, no stridor, rhonchi, wheezes, rales.  Abdominal: Soft. BS +,  no distension, tenderness, rebound or guarding.  ________ Musculoskeletal: Tenderness in the dorsum of his left foot and medial malleolus along with edema. Tenderness and edema in medial aspect of left hand extending up to the thumb.erythema also present in these areas  Neuro: Alert. Normal reflexes, muscle tone coordination. No cranial nerve deficit. Skin: Skin is warm and dry. No rash noted. Not diaphoretic. No erythema. No pallor. ____ Psychiatric: Normal mood and affect. Behavior, judgment, thought content normal. __  Lab Results  Component Value Date   WBC 10.8* 06/08/2012   HGB 12.6* 06/08/2012   HCT 36.0* 06/08/2012   MCV 77.3* 06/08/2012   PLT 223 06/08/2012   Lab Results  Component Value Date   CREATININE 1.66* 06/23/2012   BUN 24* 06/23/2012   NA 139 06/23/2012   K 3.9 06/23/2012   CL 102 06/23/2012   CO2 28 06/23/2012    Lab Results  Component Value Date   HGBA1C 5.8* 06/06/2012   Lipid Panel  No results found for this basename: chol,  trig,  hdl,  cholhdl,  vldl,  ldlcalc        Patient Active Problem List   Diagnosis Date Noted  . Influenza vaccination administered at current visit 11/15/2012  . Gout 11/15/2012  . Acute gouty arthritis 08/24/2012  . Chronic diastolic CHF (congestive heart failure) 06/06/2012  . CKD (chronic kidney disease) stage 3, GFR 30-59 ml/min 06/06/2012  . Microcytic anemia 06/06/2012  . Malignant hypertension   . Hyperlipidemia      Preventative Medicine:  Flu vaccine: ordered LABS: Metabolic panel:reviewed  CBC: reviewed Vitamin D :ordered today Lipid Panel: ordered today TSH: reviewed    Assessment and plan:  Acute gout: -We'll give another course of prednisone- he recently had one in July for acute gout in right foot and left wrist -Have given a prescription and advised patient to start allopurinol once the acute attack has resolved  Hypertension: -I have reviewed the patient's medications with him but he unaware of some of medications on his list which I have mentioned to  him  -have advised him to bring in all of his medications on his next visit  Return to clinic: 1 month  Calvert Cantor, MD

## 2012-11-15 NOTE — Progress Notes (Signed)
Patient complains of having pain in his left ankle And top of left foot for almost two weeks Denies any injury to foot Does have a history of gout

## 2012-11-15 NOTE — Patient Instructions (Signed)
You must bring in all of her medications on your next visit. You should start keeping a list of all of her medications in your wallet at all times.Gout Gout is caused by a buildup of uric acid crystals in the joints. The crystals make your joints sore. This is like having sand in your joints. Repeat attacks are common. Gout can be treated. HOME CARE   Do not take aspirin for pain.  Only take medicine as told by your doctor.  You may use cold treatments (ice) on painful joints.  Put ice in a plastic bag.  Place a towel between your skin and the bag.  Leave the ice on for 15-20 minutes at a time, 3-4 times a day.  Rest in bed as much as possible. When in bed, keep the sheets and blankets off your sore joints.  Keep the sore joints raised (elevated).  Use crutches if your legs or ankles hurt.  Drink enough water and fluids to keep your pee (urine) clear or pale yellow. This helps your body get rid of uric acid. Do not drink alcohol.  Follow diet instructions as told by your doctor.  Keep your body at a healthy weight. GET HELP RIGHT AWAY IF:   You have a temperature by mouth above 102 F (38.9 C), not controlled by medicine.  You have watery poop (diarrhea).  You are throwing up (vomiting).  You do not feel better in 1 day, or you are getting worse.  Your joint hurts more.  You have the chills. MAKE SURE YOU:   Understand these instructions.  Will watch your condition.  Will get help right away if you are not doing well or get worse. Document Released: 10/23/2007 Document Revised: 04/07/2011 Document Reviewed: 04/23/2009 [facility] Patient Information 2014 [place], [place].

## 2012-11-16 LAB — VITAMIN D 25 HYDROXY (VIT D DEFICIENCY, FRACTURES): Vit D, 25-Hydroxy: 18 ng/mL — ABNORMAL LOW (ref 30–89)

## 2012-11-22 ENCOUNTER — Telehealth: Payer: Self-pay | Admitting: Internal Medicine

## 2012-11-22 NOTE — Telephone Encounter (Signed)
Is this something we can generate for a person with  Gout? There is no indication in his chart that he can not work

## 2012-11-22 NOTE — Telephone Encounter (Signed)
Pt needs letter stating that he is unable to work because he can barely stand/walk.  Pt was here for office visit on 11/15/12.  Needs the letter for child support evaluation.

## 2012-11-24 ENCOUNTER — Ambulatory Visit: Payer: Medicaid Other

## 2012-11-25 NOTE — Telephone Encounter (Signed)
Form was filled out by Dr. Hyman Hopes and was faxed to [facility] Child Support Enforcement office on 11/25/12.

## 2012-11-29 ENCOUNTER — Ambulatory Visit: Payer: Medicaid Other

## 2012-12-13 ENCOUNTER — Ambulatory Visit: Payer: Medicaid Other

## 2013-01-06 ENCOUNTER — Encounter (HOSPITAL_COMMUNITY): Payer: Self-pay | Admitting: Emergency Medicine

## 2013-01-06 ENCOUNTER — Emergency Department (HOSPITAL_COMMUNITY): Payer: Self-pay

## 2013-01-06 ENCOUNTER — Emergency Department (HOSPITAL_COMMUNITY)
Admission: EM | Admit: 2013-01-06 | Discharge: 2013-01-06 | Disposition: A | Discharge status: Home / Self Care | Payer: Medicaid Other | Attending: Emergency Medicine | Admitting: Emergency Medicine

## 2013-01-06 DIAGNOSIS — I10 Essential (primary) hypertension: Secondary | ICD-10-CM

## 2013-01-06 DIAGNOSIS — M109 Gout, unspecified: Secondary | ICD-10-CM | POA: Insufficient documentation

## 2013-01-06 DIAGNOSIS — F17200 Nicotine dependence, unspecified, uncomplicated: Secondary | ICD-10-CM | POA: Insufficient documentation

## 2013-01-06 DIAGNOSIS — D509 Iron deficiency anemia, unspecified: Secondary | ICD-10-CM | POA: Insufficient documentation

## 2013-01-06 DIAGNOSIS — I5032 Chronic diastolic (congestive) heart failure: Secondary | ICD-10-CM | POA: Insufficient documentation

## 2013-01-06 DIAGNOSIS — Z79899 Other long term (current) drug therapy: Secondary | ICD-10-CM | POA: Insufficient documentation

## 2013-01-06 DIAGNOSIS — N183 Chronic kidney disease, stage 3 unspecified: Secondary | ICD-10-CM | POA: Insufficient documentation

## 2013-01-06 DIAGNOSIS — IMO0002 Reserved for concepts with insufficient information to code with codable children: Secondary | ICD-10-CM | POA: Insufficient documentation

## 2013-01-06 DIAGNOSIS — Z7982 Long term (current) use of aspirin: Secondary | ICD-10-CM | POA: Insufficient documentation

## 2013-01-06 DIAGNOSIS — I129 Hypertensive chronic kidney disease with stage 1 through stage 4 chronic kidney disease, or unspecified chronic kidney disease: Secondary | ICD-10-CM | POA: Insufficient documentation

## 2013-01-06 LAB — COMPREHENSIVE METABOLIC PANEL
ALT: 12 U/L (ref 0–53)
AST: 12 U/L (ref 0–37)
Alkaline Phosphatase: 65 U/L (ref 39–117)
CO2: 27 mEq/L (ref 19–32)
Calcium: 9.6 mg/dL (ref 8.4–10.5)
Chloride: 104 mEq/L (ref 96–112)
Creatinine, Ser: 1.67 mg/dL — ABNORMAL HIGH (ref 0.50–1.35)
GFR calc Af Amer: 52 mL/min — ABNORMAL LOW (ref >90)
GFR calc non Af Amer: 45 mL/min — ABNORMAL LOW (ref >90)
Glucose, Bld: 94 mg/dL (ref 70–99)
Sodium: 142 mEq/L (ref 135–145)
Total Bilirubin: 0.4 mg/dL (ref 0.3–1.2)

## 2013-01-06 LAB — CBC WITH DIFFERENTIAL/PLATELET
Basophils Absolute: 0 10*3/uL (ref 0.0–0.1)
Eosinophils Relative: 3 % (ref 0–5)
HCT: 35.1 % — ABNORMAL LOW (ref 39.0–52.0)
Lymphocytes Relative: 24 % (ref 12–46)
Lymphs Abs: 2.3 10*3/uL (ref 0.7–4.0)
MCHC: 34.8 g/dL (ref 30.0–36.0)
MCV: 78.3 fL (ref 78.0–100.0)
Monocytes Absolute: 0.7 10*3/uL (ref 0.1–1.0)
Neutro Abs: 6.4 10*3/uL (ref 1.7–7.7)
RBC: 4.48 MIL/uL (ref 4.22–5.81)
RDW: 14.8 % (ref 11.5–15.5)
WBC: 9.8 10*3/uL (ref 4.0–10.5)

## 2013-01-06 LAB — PRO B NATRIURETIC PEPTIDE: Pro B Natriuretic peptide (BNP): 779.1 pg/mL — ABNORMAL HIGH (ref 0–125)

## 2013-01-06 MED ORDER — PREDNISONE 20 MG PO TABS
50.0000 mg | ORAL_TABLET | Freq: Once | ORAL | Status: AC
  Administered 2013-01-06: 50 mg via ORAL
  Filled 2013-01-06: qty 3

## 2013-01-06 MED ORDER — OXYCODONE-ACETAMINOPHEN 5-325 MG PO TABS
1.0000 | ORAL_TABLET | ORAL | Status: DC | PRN
Start: 2013-01-06 — End: 2013-01-06

## 2013-01-06 MED ORDER — PREDNISONE 10 MG PO TABS: ORAL_TABLET | ORAL | Status: DC

## 2013-01-06 MED ORDER — CLONIDINE HCL 0.1 MG PO TABS
0.1000 mg | ORAL_TABLET | Freq: Once | ORAL | Status: AC
  Administered 2013-01-06: 0.1 mg via ORAL
  Filled 2013-01-06: qty 1

## 2013-01-06 MED ORDER — OXYCODONE-ACETAMINOPHEN 5-325 MG PO TABS: 1.0000 | ORAL_TABLET | ORAL | Status: AC | PRN

## 2013-01-06 MED ORDER — OXYCODONE-ACETAMINOPHEN 5-325 MG PO TABS
1.0000 | ORAL_TABLET | Freq: Once | ORAL | Status: AC
  Administered 2013-01-06: 1 via ORAL
  Filled 2013-01-06: qty 1

## 2013-01-06 MED ORDER — PREDNISONE 10 MG PO TABS: ORAL_TABLET | ORAL | Status: AC

## 2013-01-06 MED ORDER — HYDRALAZINE HCL 20 MG/ML IJ SOLN: 5.0000 mg | Freq: Once | INTRAMUSCULAR | Status: DC

## 2013-01-06 NOTE — ED Notes (Signed)
DP and PT pulses 81bpm and +1 on left foot

## 2013-01-06 NOTE — ED Notes (Signed)
Left foot swelling x 1 month has hx of gout but he thinks it is something else bp is high also

## 2013-01-06 NOTE — Progress Notes (Signed)
VASCULAR LAB PRELIMINARY  PRELIMINARY  PRELIMINARY  PRELIMINARY  Left lower extremity venous duplex completed.    Preliminary report:  Left:  No evidence of DVT, superficial thrombosis, or Baker's cyst.  Zohan Shiflet, RVT 01/06/2013, 12:57 PM

## 2013-01-06 NOTE — ED Provider Notes (Signed)
CSN: [id number]     Arrival date & time 01/06/13  1110 History   First MD Initiated Contact with Patient 01/06/13 1133     Chief Complaint  Patient presents with  . Foot Pain   (Consider location/radiation/quality/duration/timing/severity/associated sxs/prior Treatment) HPI Comments: The patient is a 52 year-old male with a past medical history of Gout, HTN, CKD, Diastolic HF, presenting the Emergency Department with a chief complaint of Left foot pain and swelling for 2.5 weeks.  The patient reports pain at the base of the great toe, lateral malleoli, and to the dorsal aspect of his footReports he was evaluated at [facility] last week for the same complain and had an [place] and XR of his Left foot.  He reports he was diagnosed with a gout flair up and was told to take OTC pain medication.  He was also evaluated on October 20th by his PCP and given a round of steroids and was started on allopurinol.  Patient reports compliance with HTN medications.  Upon further questioning the patient reports he does not stop taking his allopurinol during Gout attacks.  The history is provided by the patient and medical records. No language interpreter was used.    Past Medical History  Diagnosis Date  . Hypertension   . Hyperlipidemia   . Chronic diastolic CHF (congestive heart failure) 06/06/2012  . CKD (chronic kidney disease) stage 3, GFR 30-59 ml/min 06/06/2012  . Microcytic anemia 06/06/2012  . Gout 11/15/2012   History reviewed. No pertinent past surgical history. Family History  Problem Relation Age of Onset  . Hypertension Mother    History  Substance Use Topics  . Smoking status: Current Some Day Smoker  . Smokeless tobacco: Never Used  . Alcohol Use: Yes    Review of Systems  Constitutional: Negative for fever and chills.  Respiratory: Negative for cough, chest tightness, shortness of breath and wheezing.   Cardiovascular: Negative for chest pain and palpitations.   Gastrointestinal: Negative for nausea, abdominal pain, diarrhea and constipation.  Musculoskeletal: Positive for gait problem and joint swelling.  All other systems reviewed and are negative.    Allergies  Review of patient's allergies indicates no known allergies.  Home Medications   Current Outpatient Rx  Name  Route  Sig  Dispense  Refill  . amLODipine (NORVASC) 10 MG tablet   Oral   Take 1 tablet (10 mg total) by mouth at bedtime.   30 tablet   3   . aspirin 325 MG tablet   Oral   Take 325 mg by mouth daily.         . carvedilol (COREG) 3.125 MG tablet   Oral   Take 2 tablets (6.25 mg total) by mouth 2 (two) times daily with a meal.   60 tablet   3   . cloNIDine (CATAPRES) 0.1 MG tablet   Oral   Take 0.2 mg by mouth daily.         . ferrous sulfate 325 (65 FE) MG EC tablet   Oral   Take 1 tablet (325 mg total) by mouth 3 (three) times daily with meals.   60 tablet   0   . hydrALAZINE (APRESOLINE) 50 MG tablet   Oral   Take 2 tablets (100 mg total) by mouth every 8 (eight) hours.   90 tablet   3   . oxyCODONE-acetaminophen (PERCOCET/ROXICET) 5-325 MG per tablet   Oral   Take 1 tablet by mouth every 4 (four) hours  as needed for severe pain.   15 tablet   0   . predniSONE (DELTASONE) 10 MG tablet      Take 5 pills for the first 5 days Take 4 pills on day 6 Take 4 pills on day 7 Take 3 pills on day 8 Take 3 pills on day 9 Take 2 pills on day 10 Take 2 pills on day 11 Take 1 pill on day 12 Take 1 pill on day 13   45 tablet   0    BP 151/104  Pulse 74  Temp(Src) 98.2 F (36.8 C) (Oral)  Resp 17  Ht 6' (1.829 m)  Wt 248 lb (112.492 kg)  BMI 33.63 kg/m2  SpO2 100% Physical Exam  Nursing note and vitals reviewed. Constitutional: He appears well-developed and well-nourished. No distress.  HENT:  Head: Normocephalic and atraumatic.  Neck: Neck supple.  Cardiovascular: Normal rate and regular rhythm.   Pulmonary/Chest: Effort normal and  breath sounds normal. No respiratory distress. He has no wheezes. He has no rales.  Abdominal: Soft. Bowel sounds are normal. He exhibits no distension. There is no tenderness. There is no rebound and no guarding.  Musculoskeletal: He exhibits edema and tenderness.       Left ankle: He exhibits decreased range of motion and swelling. He exhibits no ecchymosis and normal pulse. Tenderness. Lateral malleolus tenderness found.  TTP over base of great toe and dorsal aspect of foot. Moderate swelling to dorsal aspect of foot.  Good pulses, no erythema, Neurologically intact.  No lesion identifiable.  Neurological: He is alert.  Skin: Skin is warm and dry. No erythema.  Psychiatric: He has a normal mood and affect.    ED Course  Procedures (including critical care time) Labs Review Labs Reviewed  CBC WITH DIFFERENTIAL - Abnormal; Notable for the following:    Hemoglobin 12.2 (*)    HCT 35.1 (*)    All other components within normal limits  COMPREHENSIVE METABOLIC PANEL - Abnormal; Notable for the following:    Creatinine, Ser 1.67 (*)    GFR calc non Af Amer 45 (*)    GFR calc Af Amer 52 (*)    All other components within normal limits  SEDIMENTATION RATE - Abnormal; Notable for the following:    Sed Rate 63 (*)    All other components within normal limits  PRO B NATRIURETIC PEPTIDE - Abnormal; Notable for the following:    Pro B Natriuretic peptide (BNP) 779.1 (*)    All other components within normal limits  URIC ACID  C-REACTIVE PROTEIN   Imaging Review Dg Chest 2 View  01/06/2013   CLINICAL DATA:  Shortness of breath  EXAM: CHEST  2 VIEW  COMPARISON:  06/06/2012  FINDINGS: Elevated right hemidiaphragm. There is no evidence of focal infiltrate small focal reason consolidation. Cardiac silhouette within normal limits. The osseous structures are unremarkable.  IMPRESSION: No active cardiopulmonary disease.   Electronically Signed   By: Salome Holmes M.D.   On: 01/06/2013 13:11   Dg  Ankle Complete Left  01/06/2013   CLINICAL DATA:  Ankle pain with swelling.  EXAM: LEFT ANKLE COMPLETE - 3+ VIEW  COMPARISON:  None.  FINDINGS: There is no evidence of acute fracture or dislocation. Areas of bone spurring identified along the medial malleolus and along the distal aspect of the lateral tibia involving the tibial fibular articulation region. Ankle mortise is intact. Areas of bone spurring also identified along the periphery of the tibial plafond. Hypertrophic  bone spur is appreciated involving the metatarsals.  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   Electronically Signed   By: Salome Holmes M.D.   On: 01/06/2013 13:08   Dg Foot Complete Left  01/06/2013   CLINICAL DATA:  Pain and swelling  EXAM: LEFT FOOT - COMPLETE 3+ VIEW  COMPARISON:  12/25/2012.  FINDINGS: There is no evidence of acute fracture nor dislocation. Areas of hypertrophic bone spurring identified along the volar aspect of the metatarsals. Mild areas of peripheral osteophytosis appreciated along the proximal and distal interphalangeal joints.  IMPRESSION: No evidence of acute osseous abnormalities.  Osteoarthritic changes.   Electronically Signed   By: Salome Holmes M.D.   On: 01/06/2013 13:10    EKG Interpretation   None       MDM   1. Gout attack   2. Hypertension    Pt presents with pain and swelling of left foot, pt with a  History of gout and most likely cause. Afebrile, no signs of infection in the foot. Will rule out other causes of unilateral lower extremity swelling.  Labs sent.  Discussed patient history and condition with Rancour, after his evaluation suggest imaging and DVT rule out Patient reports compliance with his BP medication but 164/117 on arrival, will give home medication.  Renal function is stable, Uric acid 7.5.  XR without acute findings. DVT US negative. Re-eval: patient resting comfortably in bed BP 178/104. He reports  he takes his allopurinol every day, even during gout flair ups.  Discussed lab results, imaging results, and treatment plan with the patient. Discussed several times the need to stop taking Allopurinol until his pain has resolved and he needs to see his PCP. He reports understanding and no other concerns at this time.  Patient is stable for discharge at this time.  Meds given in ED:  Medications  cloNIDine (CATAPRES) tablet 0.1 mg (0.1 mg Oral Given 01/06/13 1342)  cloNIDine (CATAPRES) tablet 0.1 mg (0.1 mg Oral Given 01/06/13 1443)  oxyCODONE-acetaminophen (PERCOCET/ROXICET) 5-325 MG per tablet 1 tablet (1 tablet Oral Given 01/06/13 1500)  predniSONE (DELTASONE) tablet 50 mg (50 mg Oral Given 01/06/13 1500)    New Prescriptions   OXYCODONE-ACETAMINOPHEN (PERCOCET/ROXICET) 5-325 MG PER TABLET    Take 1 tablet by mouth every 4 (four) hours as needed for severe pain.   PREDNISONE (DELTASONE) 10 MG TABLET    Take 5 pills for the first 5 days Take 4 pills on day 6 Take 4 pills on day 7 Take 3 pills on day 8 Take 3 pills on day 9 Take 2 pills on day 10 Take 2 pills on day 11 Take 1 pill on day 12 Take 1 pill on day 13      Clabe Seal, PA-C 01/06/13 1529

## 2013-01-06 NOTE — ED Provider Notes (Signed)
Medical screening examination/treatment/procedure(s) were conducted as a shared visit with non-physician practitioner(s) and myself.  I personally evaluated the patient during the encounter.  LLE swelling since May with worsening pain in dorsum of L foot and 1st MTP. No fever, CP, SOB. No pain in other joints. Hypertensive, states compliance but history of noncompliance. No evidence of septic joint. Intact distal pulses. Full range of motion of ankle. Doppler negative for DVT.  EKG Interpretation   None         Glynn Octave, MD 01/06/13 1535

## 2013-01-06 NOTE — Discharge Instructions (Signed)
Stop taking your Allopurinol until after your Gout flair is over.   Make a follow up appointment with your Primary Care Provider. Return if your symptoms worsen. Take the rest of your medication as prescribed. Monitor your Blood Pressure at home.

## 2013-02-17 ENCOUNTER — Encounter: Payer: Self-pay | Admitting: Internal Medicine

## 2013-02-17 ENCOUNTER — Ambulatory Visit: Payer: Medicaid Other | Attending: Internal Medicine | Admitting: Internal Medicine

## 2013-02-17 VITALS — BP 169/113 | HR 77 | Temp 98.9°F | Resp 14 | Ht 72.0 in | Wt 256.8 lb

## 2013-02-17 DIAGNOSIS — N182 Chronic kidney disease, stage 2 (mild): Secondary | ICD-10-CM | POA: Insufficient documentation

## 2013-02-17 DIAGNOSIS — I129 Hypertensive chronic kidney disease with stage 1 through stage 4 chronic kidney disease, or unspecified chronic kidney disease: Secondary | ICD-10-CM | POA: Insufficient documentation

## 2013-02-17 DIAGNOSIS — M109 Gout, unspecified: Secondary | ICD-10-CM | POA: Insufficient documentation

## 2013-02-17 DIAGNOSIS — I10 Essential (primary) hypertension: Secondary | ICD-10-CM

## 2013-02-17 LAB — CBC WITH DIFFERENTIAL/PLATELET
Basophils Absolute: 0 10*3/uL (ref 0.0–0.1)
Basophils Relative: 0 % (ref 0–1)
EOS PCT: 3 % (ref 0–5)
Eosinophils Absolute: 0.2 10*3/uL (ref 0.0–0.7)
HEMATOCRIT: 38.4 % — AB (ref 39.0–52.0)
Hemoglobin: 12.9 g/dL — ABNORMAL LOW (ref 13.0–17.0)
Lymphocytes Relative: 33 % (ref 12–46)
Lymphs Abs: 2.3 10*3/uL (ref 0.7–4.0)
MCH: 27.1 pg (ref 26.0–34.0)
MCHC: 33.6 g/dL (ref 30.0–36.0)
MCV: 80.7 fL (ref 78.0–100.0)
Monocytes Absolute: 0.5 10*3/uL (ref 0.1–1.0)
Monocytes Relative: 7 % (ref 3–12)
Neutro Abs: 4.1 10*3/uL (ref 1.7–7.7)
Neutrophils Relative %: 57 % (ref 43–77)
Platelets: 352 10*3/uL (ref 150–400)
RBC: 4.76 MIL/uL (ref 4.22–5.81)
RDW: 16.9 % — ABNORMAL HIGH (ref 11.5–15.5)
WBC: 7.1 10*3/uL (ref 4.0–10.5)

## 2013-02-17 LAB — COMPLETE METABOLIC PANEL WITH GFR
ALK PHOS: 59 U/L (ref 39–117)
ALT: 14 U/L (ref 0–53)
AST: 15 U/L (ref 0–37)
Albumin: 4.1 g/dL (ref 3.5–5.2)
BILIRUBIN TOTAL: 0.4 mg/dL (ref 0.3–1.2)
BUN: 19 mg/dL (ref 6–23)
CO2: 25 mEq/L (ref 19–32)
CREATININE: 1.7 mg/dL — AB (ref 0.50–1.35)
Calcium: 9.9 mg/dL (ref 8.4–10.5)
Chloride: 103 mEq/L (ref 96–112)
GFR, Est African American: 52 mL/min — ABNORMAL LOW
GFR, Est Non African American: 45 mL/min — ABNORMAL LOW
Glucose, Bld: 104 mg/dL — ABNORMAL HIGH (ref 70–99)
Potassium: 4.2 mEq/L (ref 3.5–5.3)
Sodium: 139 mEq/L (ref 135–145)
Total Protein: 7.1 g/dL (ref 6.0–8.3)

## 2013-02-17 LAB — POCT GLYCOSYLATED HEMOGLOBIN (HGB A1C): Hemoglobin A1C: 5.8

## 2013-02-17 LAB — URIC ACID: Uric Acid, Serum: 7.2 mg/dL (ref 4.0–7.8)

## 2013-02-17 MED ORDER — CARVEDILOL 6.25 MG PO TABS: 6.2500 mg | ORAL_TABLET | Freq: Two times a day (BID) | ORAL | Status: AC

## 2013-02-17 MED ORDER — HYDRALAZINE HCL 100 MG PO TABS: 100.0000 mg | ORAL_TABLET | Freq: Three times a day (TID) | ORAL | Status: AC

## 2013-02-17 MED ORDER — ALLOPURINOL 300 MG PO TABS: 150.0000 mg | ORAL_TABLET | Freq: Every day | ORAL | Status: AC

## 2013-02-17 MED ORDER — AMLODIPINE BESYLATE 10 MG PO TABS: 10.0000 mg | ORAL_TABLET | Freq: Every day | ORAL | Status: AC

## 2013-02-17 NOTE — Progress Notes (Signed)
Patient ID: Harold Garza, male   DOB: 1960-04-12, 53 y.o.   MRN: [id number]   CC:  HPI: 53 year old male with a history of glaucoma hypertension, chronic kidney disease, diastolic heart failure, who is here for followup of his left foot pain. The patient's last uric acid level was mildly elevated. The patient was placed on prednisone and after that the patient resumed his allopurinol. The patient's pain is now present at the base of his great toe, he does report difficulty with ambulation. He is requesting to stay out of work, even brings in disability papers to be filled out, blood pressure mildly elevated and he states that he is compliant with his antihypertensive medications, denies any cardiopulmonary symptoms   No Known Allergies Past Medical History  Diagnosis Date  . Hypertension   . Hyperlipidemia   . Chronic diastolic CHF (congestive heart failure) 06/06/2012  . CKD (chronic kidney disease) stage 3, GFR 30-59 ml/min 06/06/2012  . Microcytic anemia 06/06/2012  . Gout 11/15/2012   Current Outpatient Prescriptions on File Prior to Visit  Medication Sig Dispense Refill  . aspirin 325 MG tablet Take 325 mg by mouth daily.      . ferrous sulfate 325 (65 FE) MG EC tablet Take 1 tablet (325 mg total) by mouth 3 (three) times daily with meals.  60 tablet  0  . oxyCODONE-acetaminophen (PERCOCET/ROXICET) 5-325 MG per tablet Take 1 tablet by mouth every 4 (four) hours as needed for severe pain.  15 tablet  0  . predniSONE (DELTASONE) 10 MG tablet Take 5 pills for the first 5 days Take 4 pills on day 6 Take 4 pills on day 7 Take 3 pills on day 8 Take 3 pills on day 9 Take 2 pills on day 10 Take 2 pills on day 11 Take 1 pill on day 12 Take 1 pill on day 13  45 tablet  0   No current facility-administered medications on file prior to visit.   Family History  Problem Relation Age of Onset  . Hypertension Mother    History   Social History  . Marital Status: Single    Spouse  Name: N/A    Number of Children: N/A  . Years of Education: N/A   Occupational History  . Not on file.   Social History Main Topics  . Smoking status: Current Some Day Smoker  . Smokeless tobacco: Never Used  . Alcohol Use: Yes  . Drug Use: No  . Sexual Activity: Not on file   Other Topics Concern  . Not on file   Social History Narrative  . No narrative on file    Review of Systems  Constitutional: As in history of present illness HENT: Negative for ear pain, nosebleeds, congestion, facial swelling, rhinorrhea, neck pain, neck stiffness and ear discharge.   Eyes: Negative for pain, discharge, redness, itching and visual disturbance.  Respiratory: Negative for cough, choking, chest tightness, shortness of breath, wheezing and stridor.   Cardiovascular: Negative for chest pain, palpitations and leg swelling.  Gastrointestinal: Negative for abdominal distention.  Genitourinary: Negative for dysuria, urgency, frequency, hematuria, flank pain, decreased urine volume, difficulty urinating and dyspareunia.  Musculoskeletal: Negative for back pain, joint swelling, arthralgias and gait problem.  Neurological: Negative for dizziness, tremors, seizures, syncope, facial asymmetry, speech difficulty, weakness, light-headedness, numbness and headaches.  Hematological: Negative for adenopathy. Does not bruise/bleed easily.  Psychiatric/Behavioral: Negative for hallucinations, behavioral problems, confusion, dysphoric mood, decreased concentration and agitation.    Objective:  Filed Vitals:   02/17/13 1054  BP: 169/113  Pulse: 77  Temp: 98.9 F (37.2 C)  Resp: 14    Physical Exam  Constitutional: Appears well-developed and well-nourished. No distress.  HENT: Normocephalic. External right and left ear normal. Oropharynx is clear and moist.  Eyes: Conjunctivae and EOM are normal. PERRLA, no scleral icterus.  Neck: Normal ROM. Neck supple. No JVD. No tracheal deviation. No  thyromegaly.  CVS: RRR, S1/S2 +, no murmurs, no gallops, no carotid bruit.  Pulmonary: Effort and breath sounds normal, no stridor, rhonchi, wheezes, rales.  Abdominal: Soft. BS +,  no distension, tenderness, rebound or guarding.  Musculoskeletal: Normal range of motion. No edema and no tenderness.  Lymphadenopathy: No lymphadenopathy noted, cervical, inguinal. Neuro: Alert. Normal reflexes, muscle tone coordination. No cranial nerve deficit. Skin: Skin is warm and dry. No rash noted. Not diaphoretic. No erythema. No pallor.  Psychiatric: Normal mood and affect. Behavior, judgment, thought content normal.   Lab Results  Component Value Date   WBC 9.8 01/06/2013   HGB 12.2* 01/06/2013   HCT 35.1* 01/06/2013   MCV 78.3 01/06/2013   PLT 358 01/06/2013   Lab Results  Component Value Date   CREATININE 1.67* 01/06/2013   BUN 18 01/06/2013   NA 142 01/06/2013   K 3.9 01/06/2013   CL 104 01/06/2013   CO2 27 01/06/2013    Lab Results  Component Value Date   HGBA1C 5.8* 06/06/2012   Lipid Panel     Component Value Date/Time   CHOL 213* 11/15/2012 1101   TRIG 144 11/15/2012 1101   HDL 37* 11/15/2012 1101   CHOLHDL 5.8 11/15/2012 1101   VLDL 29 11/15/2012 1101   LDLCALC 147* 11/15/2012 1101       Assessment and plan:   Patient Active Problem List   Diagnosis Date Noted  . Influenza vaccination administered at current visit 11/15/2012  . Gout 11/15/2012  . Acute gouty arthritis 08/24/2012  . Chronic diastolic CHF (congestive heart failure) 06/06/2012  . CKD (chronic kidney disease) stage 3, GFR 30-59 ml/min 06/06/2012  . Microcytic anemia 06/06/2012  . Malignant hypertension   . 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 The patient verbalized understanding. The patient was told to call to get any lab results if not heard anything in the next week.

## 2013-02-17 NOTE — Progress Notes (Signed)
Pt is here for a f/u for Lt foot pain and hypertension. BP is 169/113 today. Pt is walking with a cane. Slight pain in foot today on a scale of 2; aggravating pain. Also complains of Rt hip pain x3 weeks.

## 2013-02-19 ENCOUNTER — Telehealth: Payer: Self-pay | Admitting: *Deleted

## 2013-02-19 NOTE — Telephone Encounter (Signed)
Message copied by Kingstyn Deruiter, [place] R on Sat Feb 19, 2013  9:57 AM ------      Message from: Susie CassetteABROL MD, Germain OsgoodNAYANA      Created: Fri Feb 18, 2013  5:14 PM       Notify patient if uric acid is still mildly elevated at 7.2, kidney function is also elevated at 1.7. The patient needs to establish with a nephrologist, please provide this referral ------

## 2013-02-19 NOTE — Telephone Encounter (Signed)
Pt is not available at this number.

## 2013-04-18 ENCOUNTER — Ambulatory Visit: Payer: Medicaid Other | Admitting: Internal Medicine

## 2013-04-18 ENCOUNTER — Ambulatory Visit: Payer: Self-pay | Attending: Internal Medicine | Admitting: Internal Medicine

## 2013-04-18 ENCOUNTER — Encounter: Payer: Self-pay | Admitting: Internal Medicine

## 2013-04-18 VITALS — BP 150/90 | HR 71 | Temp 98.0°F | Resp 16

## 2013-04-18 DIAGNOSIS — R209 Unspecified disturbances of skin sensation: Secondary | ICD-10-CM | POA: Insufficient documentation

## 2013-04-18 DIAGNOSIS — IMO0001 Reserved for inherently not codable concepts without codable children: Secondary | ICD-10-CM

## 2013-04-18 DIAGNOSIS — F17200 Nicotine dependence, unspecified, uncomplicated: Secondary | ICD-10-CM | POA: Insufficient documentation

## 2013-04-18 DIAGNOSIS — I129 Hypertensive chronic kidney disease with stage 1 through stage 4 chronic kidney disease, or unspecified chronic kidney disease: Secondary | ICD-10-CM | POA: Insufficient documentation

## 2013-04-18 DIAGNOSIS — M19079 Primary osteoarthritis, unspecified ankle and foot: Secondary | ICD-10-CM | POA: Insufficient documentation

## 2013-04-18 DIAGNOSIS — M19071 Primary osteoarthritis, right ankle and foot: Secondary | ICD-10-CM

## 2013-04-18 DIAGNOSIS — M109 Gout, unspecified: Secondary | ICD-10-CM | POA: Insufficient documentation

## 2013-04-18 DIAGNOSIS — E785 Hyperlipidemia, unspecified: Secondary | ICD-10-CM | POA: Insufficient documentation

## 2013-04-18 DIAGNOSIS — R030 Elevated blood-pressure reading, without diagnosis of hypertension: Secondary | ICD-10-CM

## 2013-04-18 DIAGNOSIS — I509 Heart failure, unspecified: Secondary | ICD-10-CM | POA: Insufficient documentation

## 2013-04-18 DIAGNOSIS — N183 Chronic kidney disease, stage 3 unspecified: Secondary | ICD-10-CM | POA: Insufficient documentation

## 2013-04-18 DIAGNOSIS — I5032 Chronic diastolic (congestive) heart failure: Secondary | ICD-10-CM | POA: Insufficient documentation

## 2013-04-18 MED ORDER — TRAMADOL HCL 50 MG PO TABS: 50.0000 mg | ORAL_TABLET | Freq: Every day | ORAL | Status: DC | PRN

## 2013-04-18 MED ORDER — CLONIDINE HCL 0.1 MG PO TABS
0.2000 mg | ORAL_TABLET | Freq: Once | ORAL | Status: AC
Start: 2013-04-18 — End: 2013-04-18
  Administered 2013-04-18: 0.2 mg via ORAL

## 2013-04-18 MED ORDER — TRAMADOL HCL 50 MG PO TABS: 50.0000 mg | ORAL_TABLET | Freq: Every day | ORAL | Status: AC | PRN

## 2013-04-18 NOTE — Patient Instructions (Signed)
DASH Diet  The DASH diet stands for "Dietary Approaches to Stop Hypertension." It is a healthy eating plan that has been shown to reduce high blood pressure (hypertension) in as little as 14 days, while also possibly providing other significant health benefits. These other health benefits include reducing the risk of breast cancer after menopause and reducing the risk of type 2 diabetes, heart disease, colon cancer, and stroke. Health benefits also include weight loss and slowing kidney failure in patients with chronic kidney disease.   DIET GUIDELINES  · Limit salt (sodium). Your diet should contain less than 1500 mg of sodium daily.  · Limit refined or processed carbohydrates. Your diet should include mostly whole grains. Desserts and added sugars should be used sparingly.  · Include small amounts of heart-healthy fats. These types of fats include nuts, oils, and tub margarine. Limit saturated and trans fats. These fats have been shown to be harmful in the body.  CHOOSING FOODS   The following food groups are based on a 2000 calorie diet. See your Registered Dietitian for individual calorie needs.  Grains and Grain Products (6 to 8 servings daily)  · Eat More Often: Whole-wheat bread, brown rice, whole-grain or wheat pasta, quinoa, popcorn without added fat or salt (air popped).  · Eat Less Often: White bread, white pasta, white rice, cornbread.  Vegetables (4 to 5 servings daily)  · Eat More Often: Fresh, frozen, and canned vegetables. Vegetables may be raw, steamed, roasted, or grilled with a minimal amount of fat.  · Eat Less Often/Avoid: Creamed or fried vegetables. Vegetables in a cheese sauce.  Fruit (4 to 5 servings daily)  · Eat More Often: All fresh, canned (in natural juice), or frozen fruits. Dried fruits without added sugar. One hundred percent fruit juice (½ cup [237 mL] daily).  · Eat Less Often: Dried fruits with added sugar. Canned fruit in light or heavy syrup.  Lean Meats, Fish, and Poultry (2  servings or less daily. One serving is 3 to 4 oz [85-114 g]).  · Eat More Often: Ninety percent or leaner ground beef, tenderloin, sirloin. Round cuts of beef, chicken breast, turkey breast. All fish. Grill, bake, or broil your meat. Nothing should be fried.  · Eat Less Often/Avoid: Fatty cuts of meat, turkey, or chicken leg, thigh, or wing. Fried cuts of meat or fish.  Dairy (2 to 3 servings)  · Eat More Often: Low-fat or fat-free milk, low-fat plain or light yogurt, reduced-fat or part-skim cheese.  · Eat Less Often/Avoid: Milk (whole, 2%). Whole milk yogurt. Full-fat cheeses.  Nuts, Seeds, and Legumes (4 to 5 servings per week)  · Eat More Often: All without added salt.  · Eat Less Often/Avoid: Salted nuts and seeds, canned beans with added salt.  Fats and Sweets (limited)  · Eat More Often: Vegetable oils, tub margarines without trans fats, sugar-free gelatin. Mayonnaise and salad dressings.  · Eat Less Often/Avoid: Coconut oils, palm oils, butter, stick margarine, cream, half and half, cookies, candy, pie.  FOR MORE INFORMATION  The Dash Diet Eating Plan: www.dashdiet.org  Document Released: 01/02/2011 Document Revised: 04/07/2011 Document Reviewed: 01/02/2011  [employer]® Patient Information ©2014 [employer].

## 2013-04-18 NOTE — Progress Notes (Signed)
MRN: [id number] Name: Harold Garza  Sex: male Age: 53 y.o. DOB: 12-11-1960  Allergies: Review of patient's allergies indicates no known allergies.  Chief Complaint  Patient presents with  . Follow-up    HPI: Patient is 53 y.o. male who comes today for followup history of gout , hypertension, reported to have chronic right ankle pain, patient had x-rays done in the past reviewed reported to have osteoarthritis, his last uric acid was within normal range denies any acute flareup, today's blood pressure is elevated denies any headache dizziness, patient was given clonidine and his repeat blood pressure is 150/90, patient also has history of renal insufficiency and is going to make appointment [place] [facility] [place] [place].  Past Medical History  Diagnosis Date  . Hypertension   . Hyperlipidemia   . Chronic diastolic CHF (congestive heart failure) 06/06/2012  . CKD (chronic kidney disease) stage 3, GFR 30-59 ml/min 06/06/2012  . Microcytic anemia 06/06/2012  . Gout 11/15/2012    History reviewed. No pertinent past surgical history.    Medication List       This list is accurate as of: 04/18/13  6:00 PM.  Always use your most recent med list.               allopurinol 300 MG tablet  Commonly known as:  ZYLOPRIM  Take 0.5 tablets (150 mg total) by mouth daily.     amLODipine 10 MG tablet  Commonly known as:  NORVASC  Take 1 tablet (10 mg total) by mouth at bedtime.     aspirin 325 MG tablet  Take 325 mg by mouth daily.     carvedilol 6.25 MG tablet  Commonly known as:  COREG  Take 1 tablet (6.25 mg total) by mouth 2 (two) times daily with a meal.     ferrous sulfate 325 (65 FE) MG EC tablet  Take 1 tablet (325 mg total) by mouth 3 (three) times daily with meals.     hydrALAZINE 100 MG tablet  Commonly known as:  APRESOLINE  Take 1 tablet (100 mg total) by mouth every 8 (eight) hours.     oxyCODONE-acetaminophen 5-325 MG per tablet  Commonly known as:   PERCOCET/ROXICET  Take 1 tablet by mouth every 4 (four) hours as needed for severe pain.     predniSONE 10 MG tablet  Commonly known as:  DELTASONE  - Take 5 pills for the first 5 days  - Take 4 pills on day 6  - Take 4 pills on day 7  - Take 3 pills on day 8  - Take 3 pills on day 9  - Take 2 pills on day 10  - Take 2 pills on day 11  - Take 1 pill on day 12  - Take 1 pill on day 13     traMADol 50 MG tablet  Commonly known as:  ULTRAM  Take 1 tablet (50 mg total) by mouth daily as needed for moderate pain.        Meds ordered this encounter  Medications  . cloNIDine (CATAPRES) tablet 0.2 mg    Sig:   . traMADol (ULTRAM) 50 MG tablet    Sig: Take 1 tablet (50 mg total) by mouth daily as needed for moderate pain.    Dispense:  30 tablet    Refill:  0    Immunization History  Administered Date(s) Administered  . Influenza Split 11/15/2012    Family History  Problem Relation Age  of Onset  . Hypertension Mother     History  Substance Use Topics  . Smoking status: Current Some Day Smoker  . Smokeless tobacco: Never Used  . Alcohol Use: Yes    Review of Systems   As noted in HPI  Filed Vitals:   04/18/13 1752  BP: 150/90  Pulse:   Temp:   Resp:     Physical Exam  Physical Exam  Eyes: EOM are normal. Pupils are equal, round, and reactive to light.  Cardiovascular: Normal rate and regular rhythm.   Pulmonary/Chest: Breath sounds normal. No respiratory distress. He has no wheezes. He has no rales.  Musculoskeletal:  Right ankle some swelling, no erythema or warmth, some pain with flexion and extension and ankle joint.    CBC    Component Value Date/Time   WBC 7.1 02/17/2013 1115   RBC 4.76 02/17/2013 1115   RBC 4.67 06/06/2012 2156   HGB 12.9* 02/17/2013 1115   HCT 38.4* 02/17/2013 1115   PLT 352 02/17/2013 1115   MCV 80.7 02/17/2013 1115   LYMPHSABS 2.3 02/17/2013 1115   MONOABS 0.5 02/17/2013 1115   EOSABS 0.2 02/17/2013 1115   BASOSABS 0.0  02/17/2013 1115    CMP     Component Value Date/Time   NA 139 02/17/2013 1115   K 4.2 02/17/2013 1115   CL 103 02/17/2013 1115   CO2 25 02/17/2013 1115   GLUCOSE 104* 02/17/2013 1115   BUN 19 02/17/2013 1115   CREATININE 1.70* 02/17/2013 1115   CREATININE 1.67* 01/06/2013 1209   CALCIUM 9.9 02/17/2013 1115   PROT 7.1 02/17/2013 1115   ALBUMIN 4.1 02/17/2013 1115   AST 15 02/17/2013 1115   ALT 14 02/17/2013 1115   ALKPHOS 59 02/17/2013 1115   BILITOT 0.4 02/17/2013 1115   GFRNONAA 45* 01/06/2013 1209   GFRAA 52* 01/06/2013 1209    Lab Results  Component Value Date/Time   CHOL 213* 11/15/2012 11:01 AM    No components found with this basename: hga1c    Lab Results  Component Value Date/Time   AST 15 02/17/2013 11:15 AM    Assessment and Plan  Elevated blood pressure - Plan: cloNIDine (CATAPRES) tablet 0.2 mg Repeat manual blood pressure is 150/90, ? Patient to call her medications today, I have advised patient for DASH diet continue with Norvasc Coreg and hydralazine.  Gout Uric acid is normal range. Continue with allopurinol.  Osteoarthritis of right ankle - Plan: Patient will take Tylenol, I have prescribed traMADol (ULTRAM) 50 MG tablet when necessary for severe pain     Return for BP check in 2 weeks .  Doris Cheadle, MD

## 2013-04-18 NOTE — Progress Notes (Signed)
Patient here for follow up Complains right foot is swollen Tingling in his finger tips

## 2013-05-03 ENCOUNTER — Ambulatory Visit: Payer: Self-pay | Attending: Internal Medicine | Admitting: Pharmacist

## 2013-05-03 VITALS — BP 138/91 | HR 62

## 2013-05-03 DIAGNOSIS — I10 Essential (primary) hypertension: Secondary | ICD-10-CM

## 2013-05-03 NOTE — Progress Notes (Signed)
Patient here for blood pressure check Blood pressure today 153/99 right arm Rested for about  Twenty minutes Blood pressure re checked 138/91 We will have him return in one week for re check on his blood pressure

## 2014-01-13 IMAGING — CR DG CHEST 2V
2 series · 2 of 2 positions shown · non-contrast
Comparison: 06/06/2012

CLINICAL DATA: Shortness of breath

EXAM:
CHEST  2 VIEW

[w chest pa]
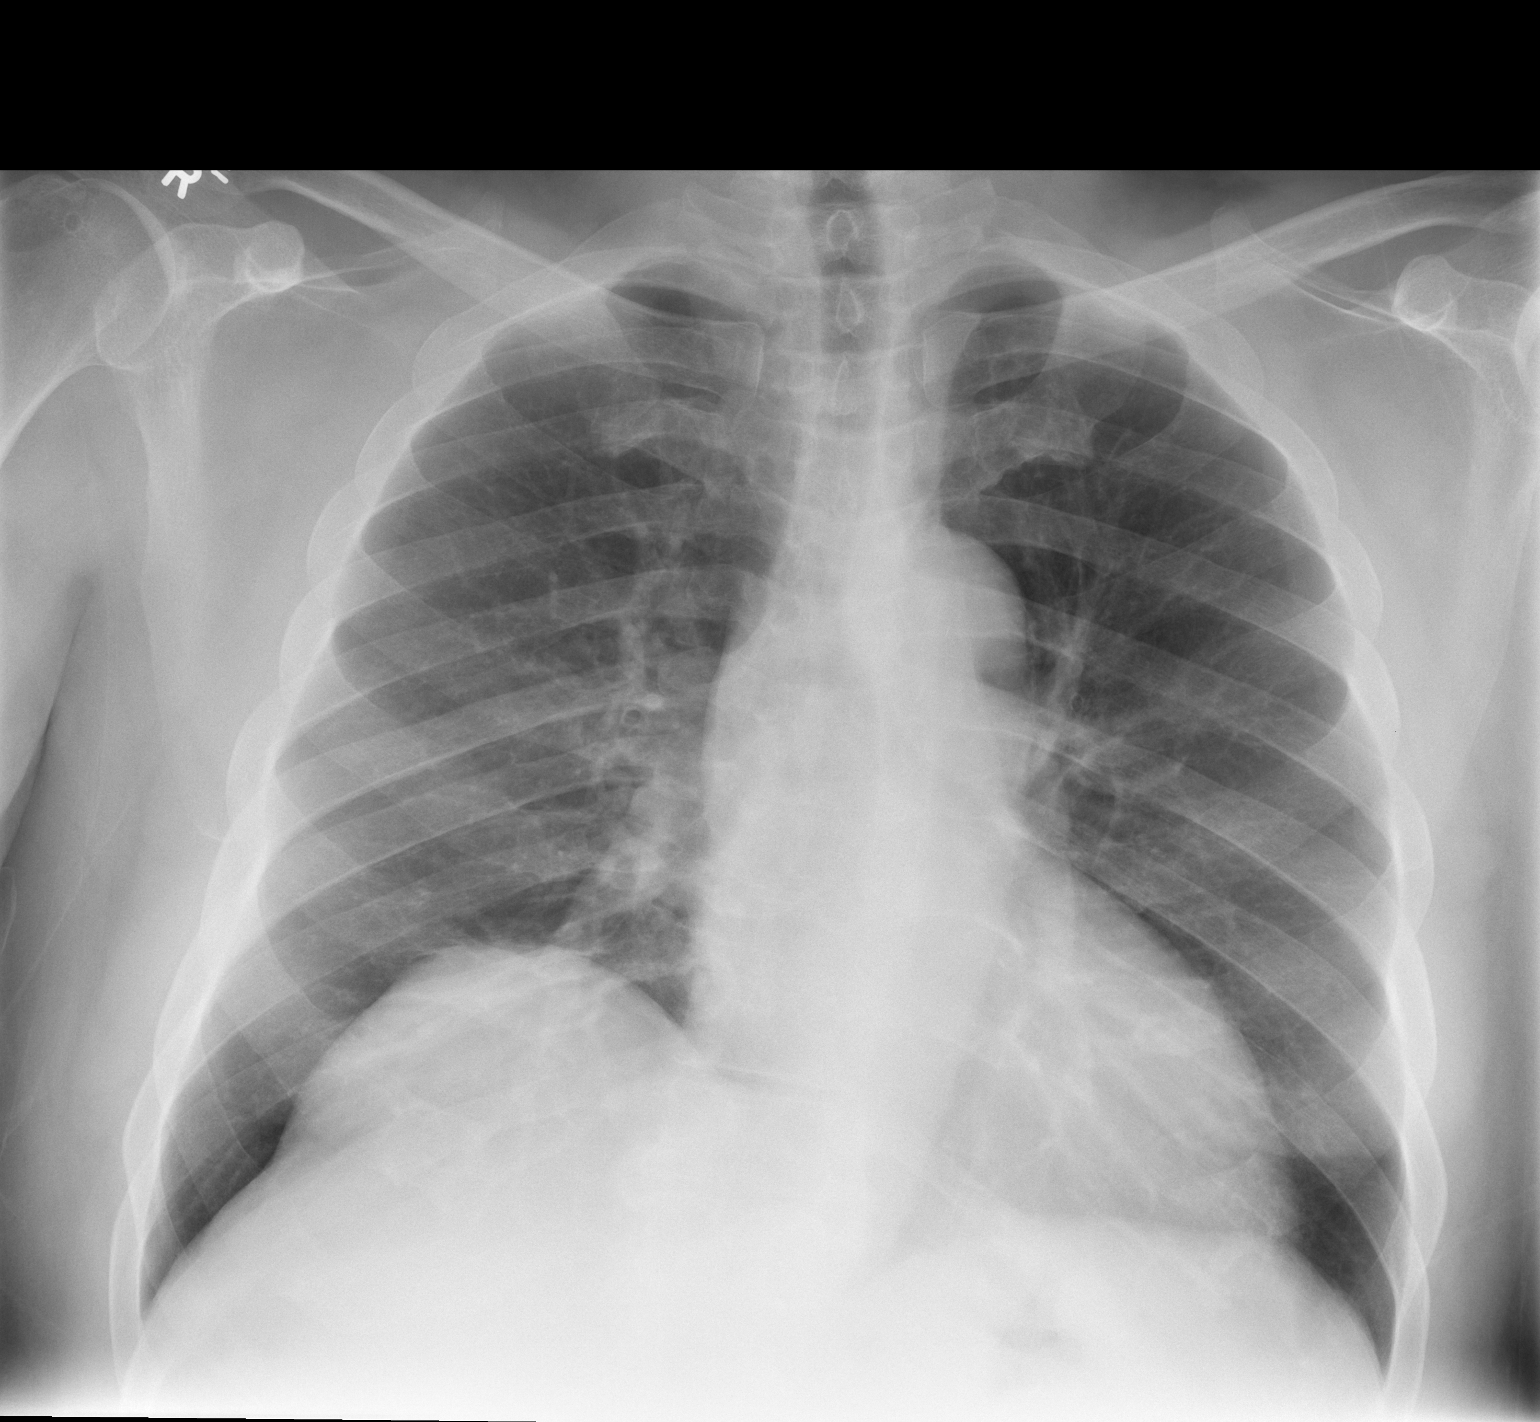

[w chest lat]
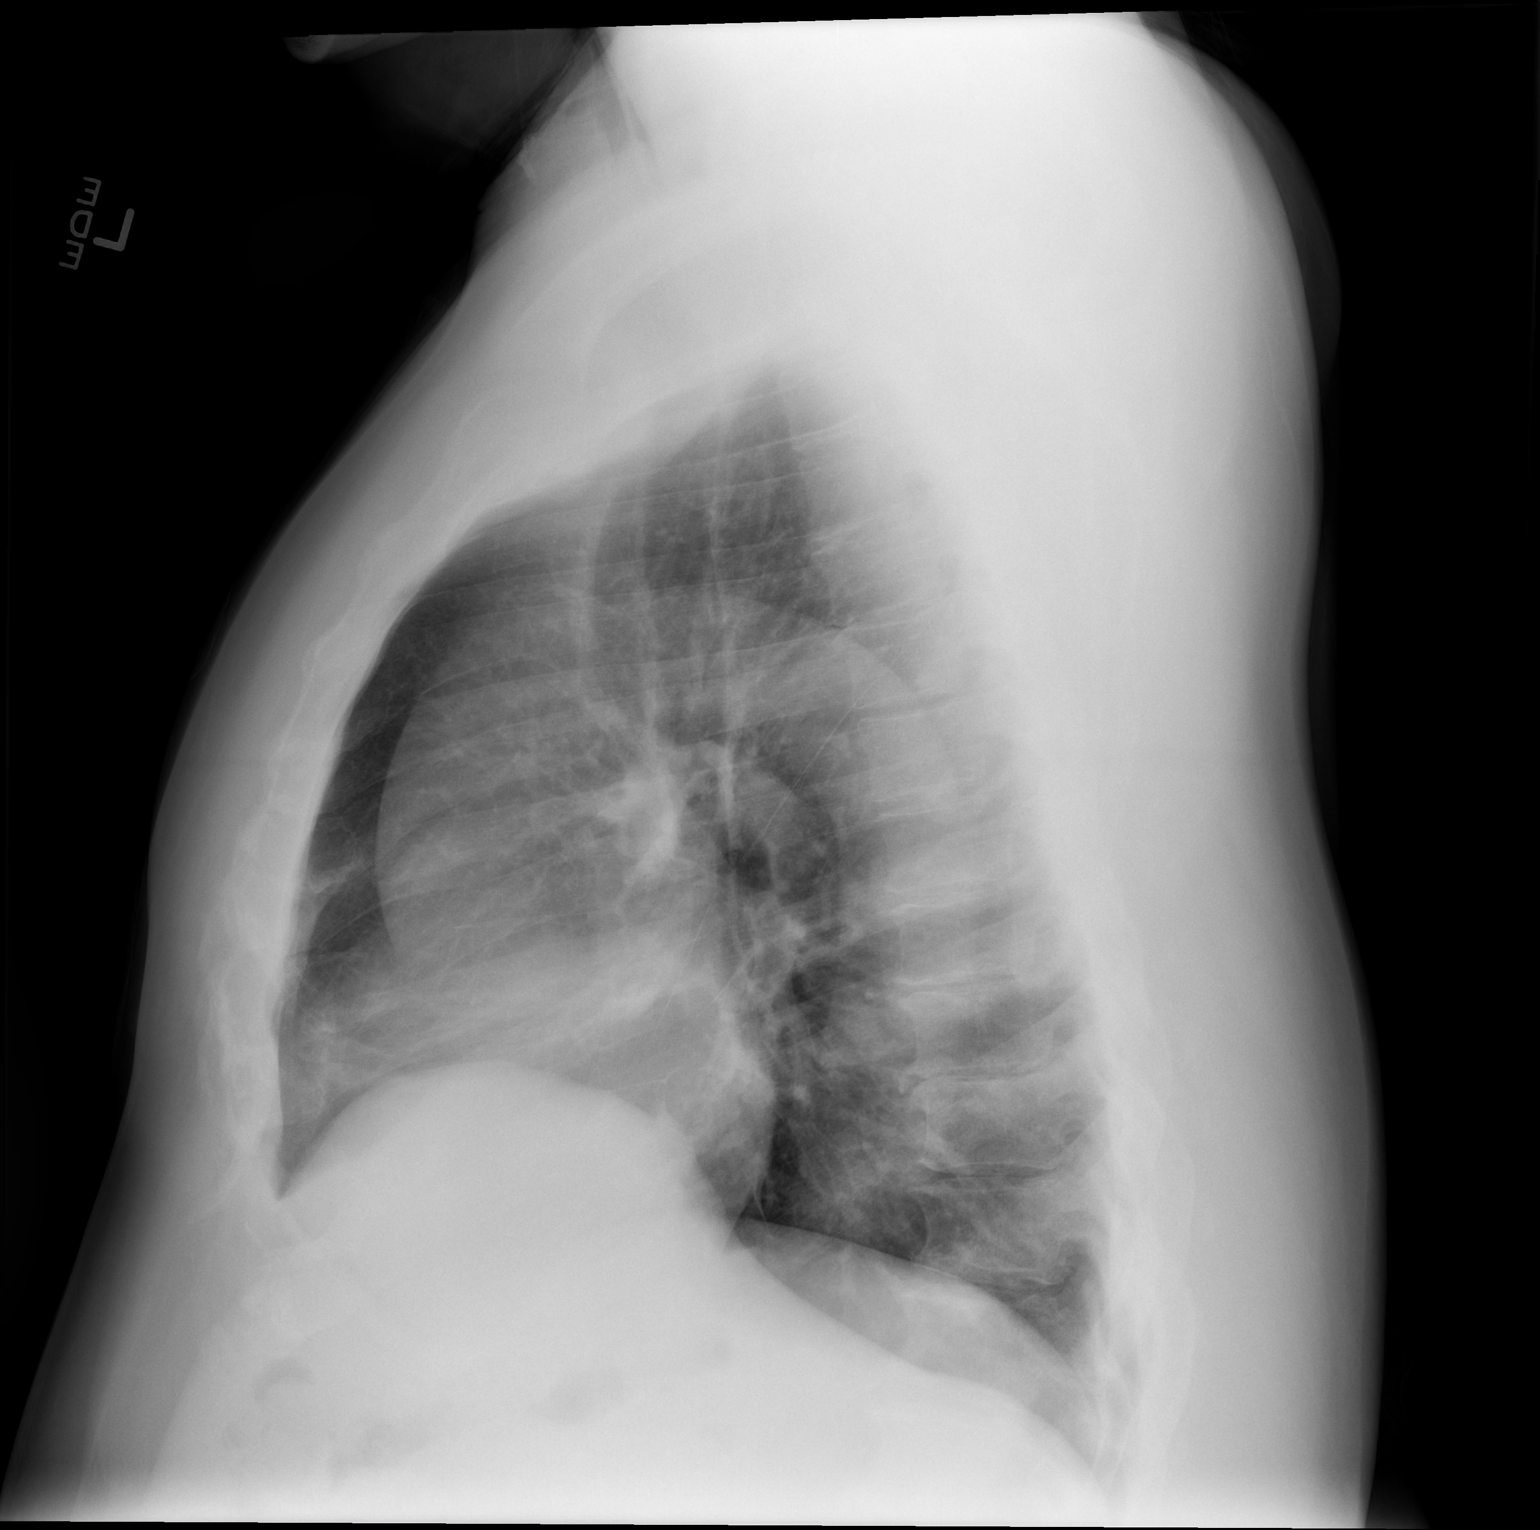

[2 of 2 positions shown; findings below may reference images not displayed]

FINDINGS: Elevated right hemidiaphragm. There is no evidence of focal
infiltrate small focal reason consolidation. Cardiac silhouette
within normal limits. The osseous structures are unremarkable.
IMPRESSION: No active cardiopulmonary disease.

## 2014-09-12 ENCOUNTER — Encounter: Payer: Self-pay | Admitting: Pharmacist

## 2015-07-28 DEATH — deceased
